# Patient Record
Sex: Female | Born: 2000 | Race: White | Hispanic: No | Marital: Single | Smoking: Never smoker
Health system: Southern US, Community
[De-identification: ages and names within clinical notes are randomized; demographics above are authoritative.]

## PROBLEM LIST (undated history)

## (undated) ENCOUNTER — Inpatient Hospital Stay: Payer: Self-pay

## (undated) DIAGNOSIS — IMO0002 Reserved for concepts with insufficient information to code with codable children: Secondary | ICD-10-CM

## (undated) DIAGNOSIS — O0930 Supervision of pregnancy with insufficient antenatal care, unspecified trimester: Secondary | ICD-10-CM

## (undated) HISTORY — PX: OTHER SURGICAL HISTORY: SHX169

## (undated) HISTORY — PX: CHOLECYSTECTOMY: SHX55

---
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
## 2019-08-14 IMAGING — CR LEFT FOREARM - 2 VIEW
1 series · 2 of 2 positions shown · non-contrast
Comparison: None.

EXAM:
LEFT FOREARM - 2 VIEW

[Series 1: dg forearm left · 0.14mm/px · 2 of 2 slices shown]
[im 1/2]
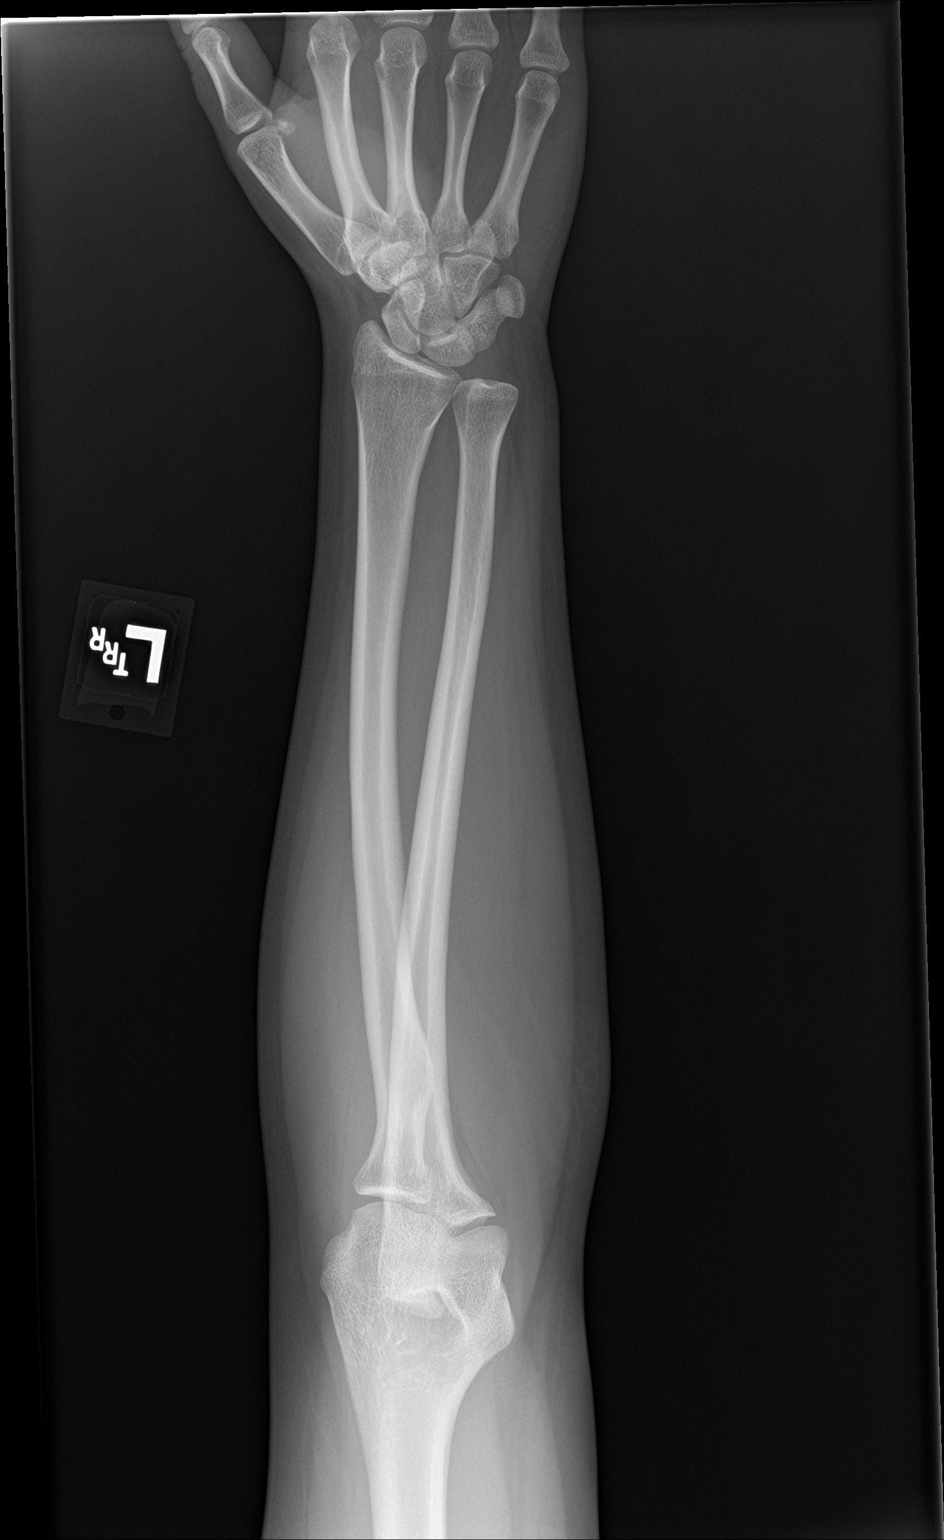
[im 2/2]
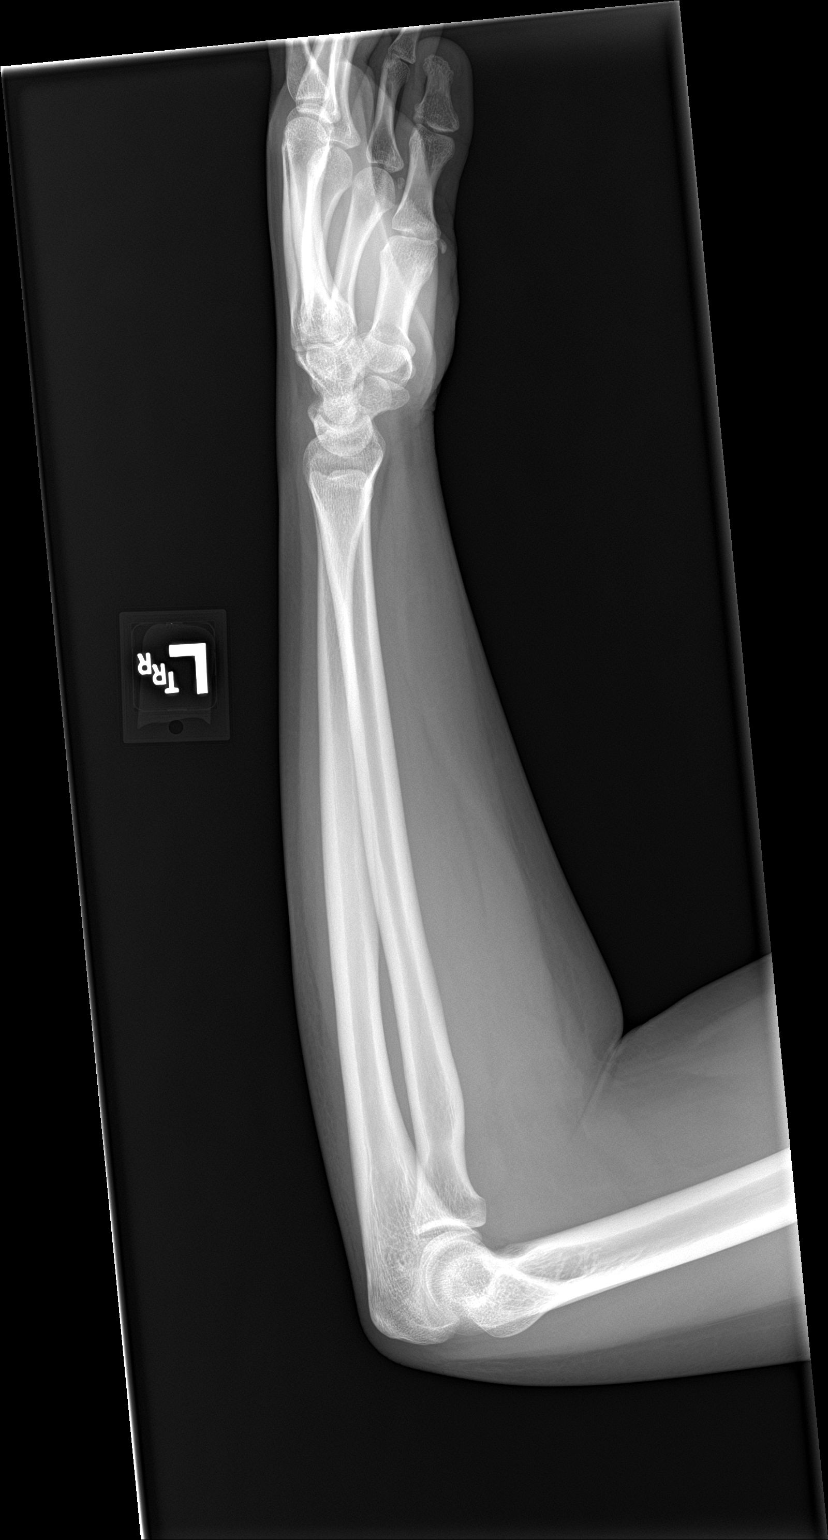

[2 of 2 positions shown; findings below may reference images not displayed]

FINDINGS: There is no evidence of fracture or other focal bone lesions. Soft
tissues are unremarkable.
IMPRESSION: Negative.

## 2019-12-31 ENCOUNTER — Emergency Department: Payer: Medicaid Other

## 2019-12-31 ENCOUNTER — Emergency Department
Admission: EM | Admit: 2019-12-31 | Discharge: 2020-01-01 | Disposition: A | Discharge status: Home / Self Care | Payer: Medicaid Other | Attending: Emergency Medicine | Admitting: Emergency Medicine

## 2019-12-31 ENCOUNTER — Other Ambulatory Visit: Payer: Self-pay

## 2019-12-31 DIAGNOSIS — M25571 Pain in right ankle and joints of right foot: Secondary | ICD-10-CM | POA: Diagnosis present

## 2019-12-31 DIAGNOSIS — Z5321 Procedure and treatment not carried out due to patient leaving prior to being seen by health care provider: Secondary | ICD-10-CM | POA: Diagnosis not present

## 2019-12-31 NOTE — ED Triage Notes (Signed)
Last night patient states she was sitting on her feet with her legs under her and she went to turn to her hip and head a pop. She tried to tough it out today but states she took advil but it is not doing anything. Right ankle is a little swollen.

## 2020-07-22 ENCOUNTER — Telehealth: Payer: Self-pay | Admitting: Obstetrics & Gynecology

## 2020-07-22 NOTE — Telephone Encounter (Signed)
Sarah Kennedy referring for NOB. Phone number on file not accepting calls at this time.x 2

## 2020-07-25 NOTE — Telephone Encounter (Signed)
Phone number on file not accepting calls at this time. Unable to leave message for patient to call back to scheduled

## 2020-08-13 IMAGING — CR DG ANKLE COMPLETE 3+V*R*
1 series · 3 of 3 positions shown · non-contrast
Comparison: None.

CLINICAL DATA: 18-year-old female with right ankle pain.

EXAM:
RIGHT ANKLE - COMPLETE 3+ VIEW

[Series 1: x ankle ap right · 0.14mm/px · 3 of 3 slices shown]
[im 1/3]
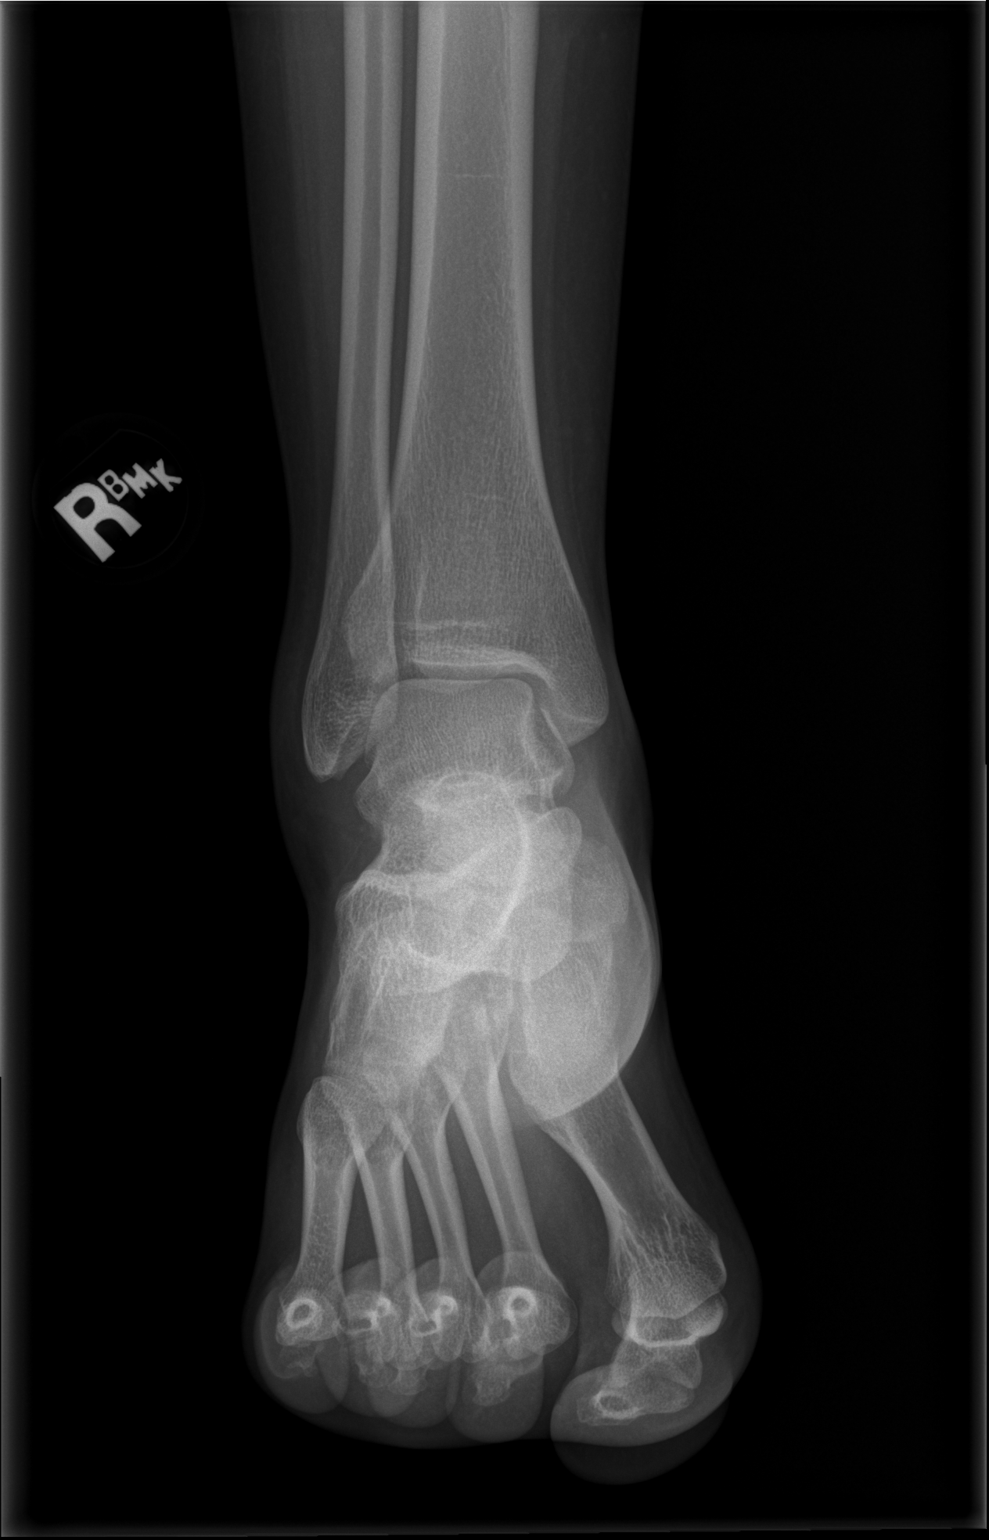
[im 2/3]
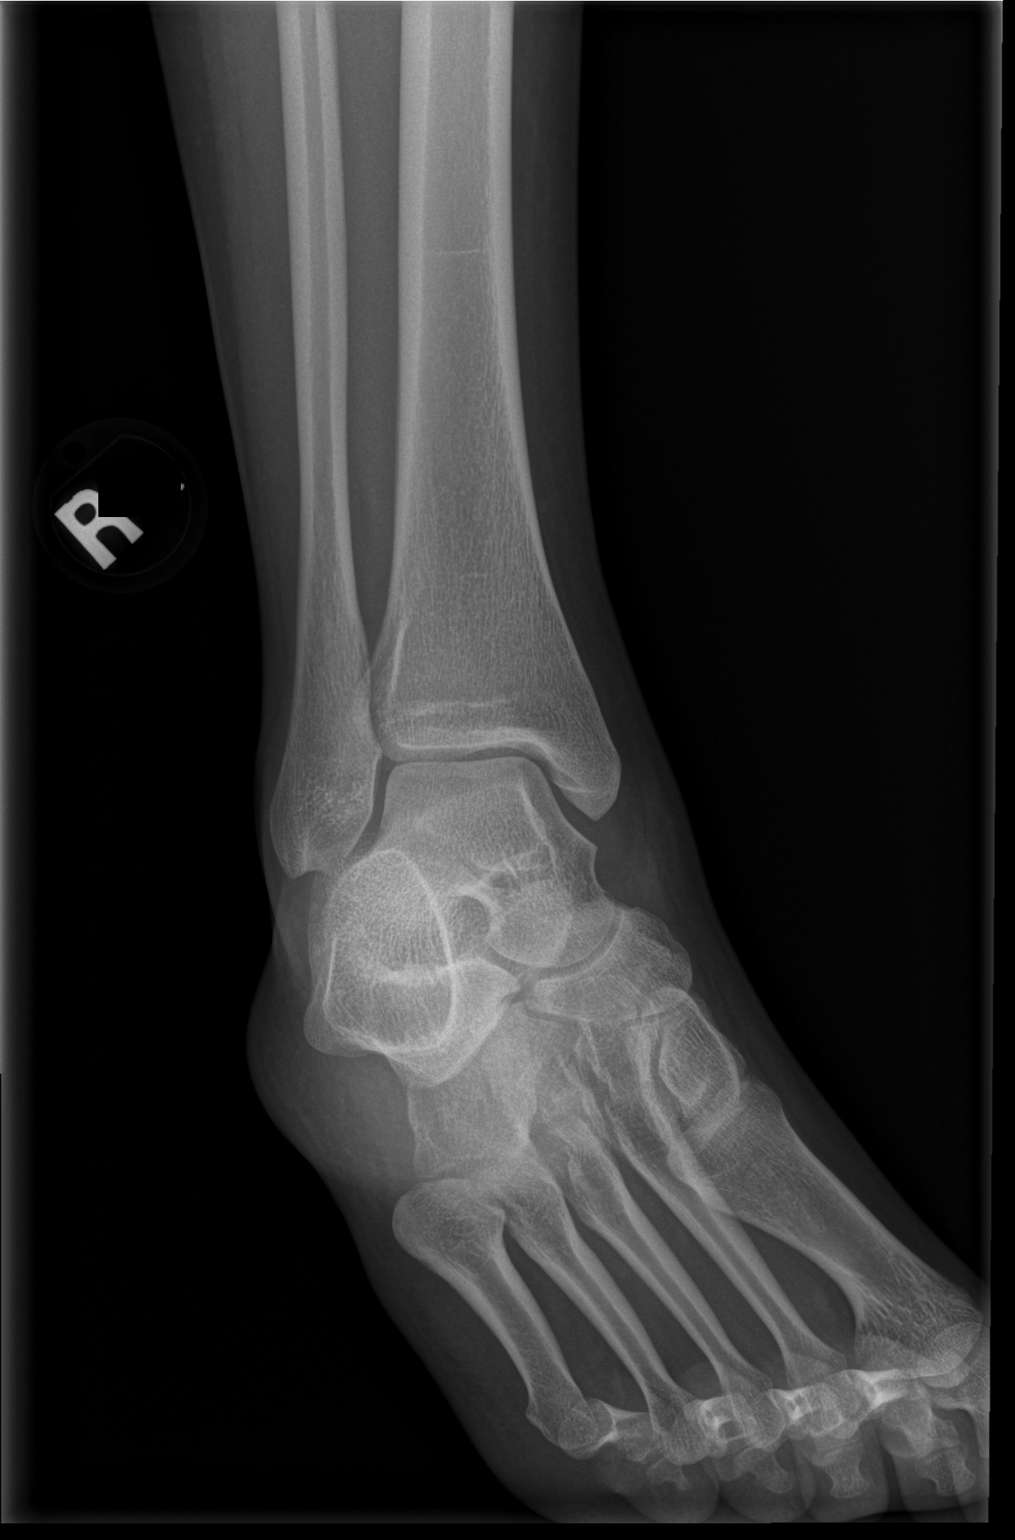
[im 3/3]
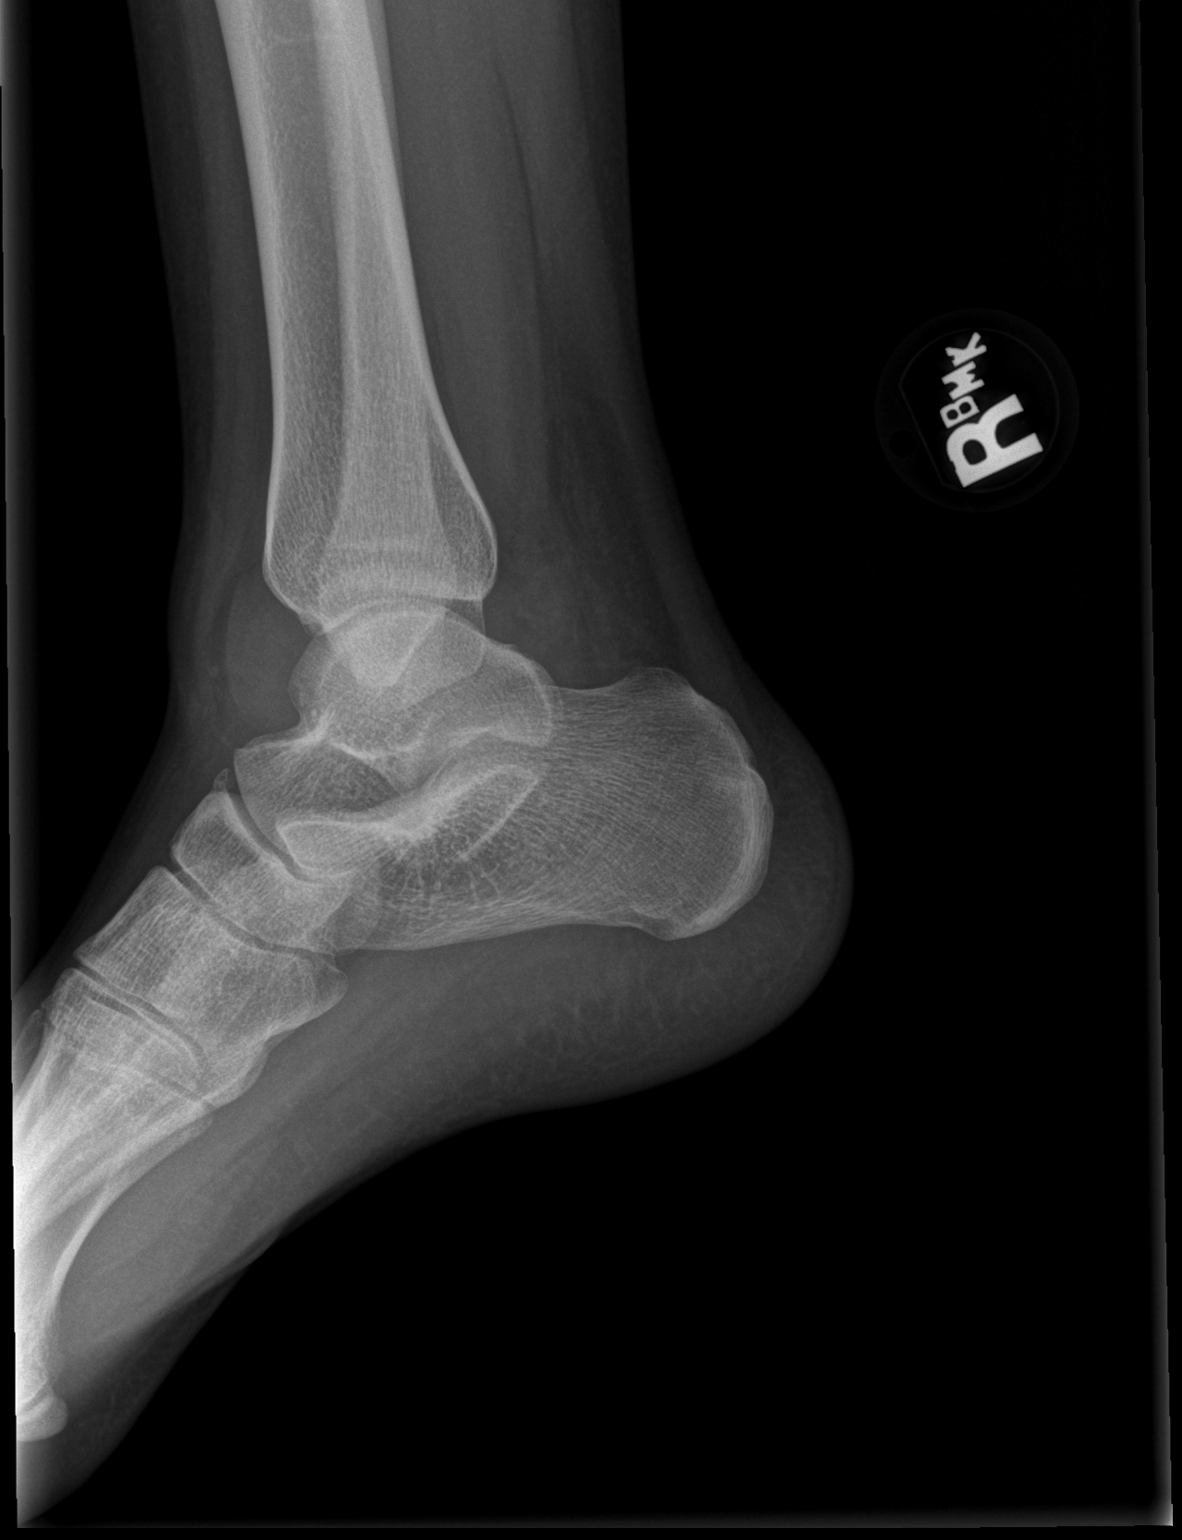

[3 of 3 positions shown; findings below may reference images not displayed]

FINDINGS: There is no acute fracture or dislocation. The bones are well
mineralized. No arthritic changes. The ankle mortise is intact. A
small amount of effusion may be present anterior to the ankle. The
soft tissues are unremarkable.
IMPRESSION: 1. No acute fracture or dislocation.
2. Probable small effusion anterior to the ankle.

## 2021-01-13 ENCOUNTER — Emergency Department: Payer: Medicaid Other

## 2021-01-13 ENCOUNTER — Emergency Department
Admission: EM | Admit: 2021-01-13 | Discharge: 2021-01-13 | Disposition: A | Discharge status: Home / Self Care | Payer: Medicaid Other | Attending: Emergency Medicine | Admitting: Emergency Medicine

## 2021-01-13 ENCOUNTER — Other Ambulatory Visit: Payer: Self-pay

## 2021-01-13 DIAGNOSIS — J988 Other specified respiratory disorders: Secondary | ICD-10-CM | POA: Insufficient documentation

## 2021-01-13 DIAGNOSIS — R0602 Shortness of breath: Secondary | ICD-10-CM

## 2021-01-13 DIAGNOSIS — Z20822 Contact with and (suspected) exposure to covid-19: Secondary | ICD-10-CM | POA: Insufficient documentation

## 2021-01-13 LAB — RESP PANEL BY RT-PCR (FLU A&B, COVID) ARPGX2
Influenza A by PCR: NEGATIVE
Influenza B by PCR: NEGATIVE
SARS Coronavirus 2 by RT PCR: NEGATIVE

## 2021-01-13 MED ORDER — AEROCHAMBER MV MISC: 0 refills | Status: DC

## 2021-01-13 MED ORDER — ALBUTEROL SULFATE HFA 108 (90 BASE) MCG/ACT IN AERS: 2.0000 | INHALATION_SPRAY | Freq: Four times a day (QID) | RESPIRATORY_TRACT | 2 refills | Status: DC | PRN

## 2021-01-13 MED ORDER — IPRATROPIUM-ALBUTEROL 0.5-2.5 (3) MG/3ML IN SOLN
3.0000 mL | Freq: Once | RESPIRATORY_TRACT | Status: AC
  Administered 2021-01-13: 3 mL via RESPIRATORY_TRACT
  Filled 2021-01-13: qty 3

## 2021-01-13 MED ORDER — PREDNISONE 10 MG (21) PO TBPK: ORAL_TABLET | ORAL | 0 refills | Status: DC

## 2021-01-13 NOTE — ED Provider Notes (Signed)
[facility] Emergency Department Provider Note   ____________________________________________   Event Date/Time   First MD Initiated Contact with Patient 01/13/21 [phone]     (approximate)  I have reviewed the triage vital signs and the nursing notes.   HISTORY  Chief Complaint Shortness of Breath    HPI Sarah Kennedy is a 20 y.o. female with no stated past medical history and past surgical history of cholecystectomy who presents for shortness of breath that began last night.  Patient also endorses chest pressure as if "something is sitting on my chest".  Patient states that this the shortness of breath has been worsening since that began last night.  Patient denies any past medical history of COPD, asthma, or tobacco abuse.  Patient currently denies any vision changes, tinnitus, difficulty speaking, facial droop, sore throat, abdominal pain, nausea/vomiting/diarrhea, dysuria, or weakness/numbness/paresthesias in any extremity         Past Medical History:  Diagnosis Date  . Late prenatal care    @32  weeks  . Teen pregnancy     Patient Active Problem List   Diagnosis Date Noted  . Postpartum care following vaginal delivery 11/16/2015    Past Surgical History:  Procedure Laterality Date  . CHOLECYSTECTOMY    . denies      Prior to Admission medications   Medication Sig Start Date End Date Taking? Authorizing Provider  albuterol (VENTOLIN HFA) 108 (90 Base) MCG/ACT inhaler Inhale 2 puffs into the lungs every 6 (six) hours as needed for wheezing or shortness of breath. 01/13/21  Yes 03/15/21, MD  predniSONE (STERAPRED UNI-PAK 21 TAB) 10 MG (21) TBPK tablet As described on package 01/13/21  Yes 03/15/21, MD  Spacer/Aero-Holding Chambers (AEROCHAMBER MV) inhaler Use as instructed 01/13/21  Yes 03/15/21, MD  cyclobenzaprine (FLEXERIL) 5 MG tablet Take 1-2 tablets 3 times daily as needed 12/31/18   01/02/19, PA-C  ferrous  fumarate (HEMOCYTE - 106 MG FE) 325 (106 Fe) MG TABS tablet Take 1 tablet (106 mg of iron total) by mouth daily. 11/20/15   01/18/16, CNM  HYDROcodone-acetaminophen (NORCO) 5-325 MG tablet Take 1 tablet by mouth every 6 (six) hours as needed for moderate pain or severe pain. 11/20/15   01/18/16, CNM  ibuprofen (ADVIL,MOTRIN) 600 MG tablet Take 1 tablet (600 mg total) by mouth every 6 (six) hours as needed. 12/31/18   01/02/19, PA-C  Prenatal Vit-Fe Fumarate-FA (PRENATAL MULTIVITAMIN) TABS tablet Take 1 tablet by mouth daily at 12 noon.    [provider]    Allergies Patient has no known allergies.  No family history on file.  Social History Social History   Tobacco Use  . Smoking status: Never Smoker  . Smokeless tobacco: Never Used  Substance Use Topics  . Alcohol use: No  . Drug use: No    Review of Systems Constitutional: No fever/chills Eyes: No visual changes. ENT: No sore throat. Cardiovascular: Endorses chest pressure. Respiratory: Endorses shortness of breath. Gastrointestinal: No abdominal pain.  No nausea, no vomiting.  No diarrhea. Genitourinary: Negative for dysuria. Musculoskeletal: Negative for acute arthralgias Skin: Negative for rash. Neurological: Negative for headaches, weakness/numbness/paresthesias in any extremity Psychiatric: Negative for suicidal ideation/homicidal ideation   ____________________________________________   PHYSICAL EXAM:  VITAL SIGNS: ED Triage Vitals  Enc Vitals Group     BP 01/13/21 0943 (!) 102/56     Pulse Rate 01/13/21 0939 (!) 107     Resp 01/13/21 0939 03/15/21)  22     Temp 01/13/21 0939 97.7 F (36.5 C)     Temp Source 01/13/21 0939 Oral     SpO2 01/13/21 0939 97 %     Weight 01/13/21 0941 150 lb (68 kg)     Height 01/13/21 0941 5\' 1"  (1.549 m)     Head Circumference --      Peak Flow --      Pain Score 01/13/21 0940 8     Pain Loc --      Pain Edu? --      Excl. in GC? --     Constitutional: Alert and oriented. Well appearing overweight Caucasian female who appears stated age and in no acute distress. Eyes: Conjunctivae are normal. PERRL. Head: Atraumatic. Nose: No congestion/rhinnorhea. Mouth/Throat: Mucous membranes are moist. Neck: No stridor Cardiovascular: Grossly normal heart sounds.  Good peripheral circulation. Respiratory: Respiratory effort with inspiratory and expiratory wheezes over bilateral lung fields.  No retractions. Gastrointestinal: Soft and nontender. No distention. Musculoskeletal: No obvious deformities Neurologic:  Normal speech and language. No gross focal neurologic deficits are appreciated. Skin:  Skin is warm and dry. No rash noted. Psychiatric: Mood and affect are normal. Speech and behavior are normal.  ____________________________________________   LABS (all labs ordered are listed, but only abnormal results are displayed)  Labs Reviewed  RESP PANEL BY RT-PCR (FLU A&B, COVID) [id number]   ____________________________________________  EKG  ED ECG REPORT I, 03/15/21, the attending physician, personally viewed and interpreted this ECG.  Date: 01/13/2021 EKG Time: 0944 Rate: 98 Rhythm: normal sinus rhythm QRS Axis: normal Intervals: normal ST/T Wave abnormalities: normal Narrative Interpretation: no evidence of acute ischemia  ____________________________________________  RADIOLOGY  ED MD interpretation: View portable chest x-ray shows no evidence of acute abnormalities including no pneumonia, pneumothorax, or widened mediastinum  Official radiology report(s): DG Chest Port 1 View  Result Date: 01/13/2021 CLINICAL DATA:  Shortness of breath EXAM: PORTABLE CHEST 1 VIEW COMPARISON:  None. FINDINGS: Low lung volumes. No consolidation or edema. No pleural effusion or pneumothorax. Cardiomediastinal contours are within normal limits with normal heart size. No acute osseous abnormality. IMPRESSION: No acute process  in the chest. Electronically Signed   By: 03/15/2021 M.D.   On: 01/13/2021 11:16    ____________________________________________   PROCEDURES  Procedure(s) performed (including Critical Care):  .1-3 Lead EKG Interpretation Performed by: 03/15/2021, MD Authorized by: Merwyn Katos, MD     Interpretation: normal     ECG rate:  91   ECG rate assessment: normal     Rhythm: sinus rhythm     Ectopy: none     Conduction: normal       ____________________________________________   INITIAL IMPRESSION / ASSESSMENT AND PLAN / ED COURSE  As part of my medical decision making, I reviewed the following data within the electronic MEDICAL RECORD NUMBER Nursing notes reviewed and incorporated, Labs reviewed, EKG interpreted, Old chart reviewed, Radiograph reviewed and Notes from prior ED visits reviewed and incorporated        Presentation most consistent with acute upper respiratory infection with wheezing.  Presentation less concerning for pneumonia, heart failure, foreign body airway obstruction, pulmonary embolism, tamponade, atypical ACS  Reassuring factors: No AMS, silent respirations, belly-breathing, or other sign of impending ventilatory failure. Never intubated or admitted to the hospital for asthma exacerbation.  Workup Defer labs and imaging given clinically in exacerbation of known asthma with similar exacerbation presentations per patient.  Reassessment: Patient respiratory status  improved with breathing treatment.  Disposition: Discharge home with return precautions. Advised to follow up with primary care physician within next 24-48 hours.      ____________________________________________   FINAL CLINICAL IMPRESSION(S) / ED DIAGNOSES  Final diagnoses:  Wheezing-associated respiratory infection  SOB (shortness of breath)     ED Discharge Orders         Ordered    albuterol (VENTOLIN HFA) 108 (90 Base) MCG/ACT inhaler  Every 6 hours PRN         01/13/21 1136    Spacer/Aero-Holding Chambers (AEROCHAMBER MV) inhaler        01/13/21 1136    predniSONE (STERAPRED UNI-PAK 21 TAB) 10 MG (21) TBPK tablet        01/13/21 1136           Note:  This document was prepared using Dragon voice recognition software and may include unintentional dictation errors.   Merwyn Katos, MD 01/13/21 [phone]

## 2021-01-13 NOTE — ED Triage Notes (Signed)
Pt states since last night she has had difficulty breathing and it has gotten worse this morning- pt states it feels like "something is sitting on my chest"- pt tearful in triage room, able to speak in complete sentences

## 2023-02-18 ENCOUNTER — Ambulatory Visit (INDEPENDENT_AMBULATORY_CARE_PROVIDER_SITE_OTHER): Payer: Self-pay

## 2023-02-18 VITALS — Wt 150.0 lb

## 2023-02-18 DIAGNOSIS — Z3689 Encounter for other specified antenatal screening: Secondary | ICD-10-CM

## 2023-02-18 DIAGNOSIS — Z369 Encounter for antenatal screening, unspecified: Secondary | ICD-10-CM

## 2023-02-18 DIAGNOSIS — Z3480 Encounter for supervision of other normal pregnancy, unspecified trimester: Secondary | ICD-10-CM | POA: Insufficient documentation

## 2023-02-18 NOTE — Patient Instructions (Signed)
First Trimester of Pregnancy  The first trimester of pregnancy starts on the first day of your last menstrual period until the end of week 12. This is also called months 1 through 3 of pregnancy. Body changes during your first trimester Your body goes through many changes during pregnancy. The changes usually return to normal after your baby is born. Physical changes You may gain or lose weight. Your breasts may grow larger and hurt. The area around your nipples may get darker. Dark spots or blotches may develop on your face. You may have changes in your hair. Health changes You may feel like you might vomit (nauseous), and you may vomit. You may have heartburn. You may have headaches. You may have trouble pooping (constipation). Your gums may bleed. Other changes You may get tired easily. You may pee (urinate) more often. Your menstrual periods will stop. You may not feel hungry. You may want to eat certain kinds of food. You may have changes in your emotions from day to day. You may have more dreams. Follow these instructions at home: Medicines Take over-the-counter and prescription medicines only as told by your doctor. Some medicines are not safe during pregnancy. Take a prenatal vitamin that contains at least 600 micrograms (mcg) of folic acid. Eating and drinking Eat healthy meals that include: Fresh fruits and vegetables. Whole grains. Good sources of protein, such as meat, eggs, or tofu. Low-fat dairy products. Avoid raw meat and unpasteurized juice, milk, and cheese. If you feel like you may vomit, or you vomit: Eat 4 or 5 small meals a day instead of 3 large meals. Try eating a few soda crackers. Drink liquids between meals instead of during meals. You may need to take these actions to prevent or treat trouble pooping: Drink enough fluids to keep your pee (urine) pale yellow. Eat foods that are high in fiber. These include beans, whole grains, and fresh fruits and  vegetables. Limit foods that are high in fat and sugar. These include fried or sweet foods. Activity Exercise only as told by your doctor. Most people can do their usual exercise routine during pregnancy. Stop exercising if you have cramps or pain in your lower belly (abdomen) or low back. Do not exercise if it is too hot or too humid, or if you are in a place of great height (high altitude). Avoid heavy lifting. If you choose to, you may have sex unless your doctor tells you not to. Relieving pain and discomfort Wear a good support bra if your breasts are sore. Rest with your legs raised (elevated) if you have leg cramps or low back pain. If you have bulging veins (varicose veins) in your legs: Wear support hose as told by your doctor. Raise your feet for 15 minutes, 3-4 times a day. Limit salt in your food. Safety Wear your seat belt at all times when you are in a car. Talk with your doctor if someone is hurting you or yelling at you. Talk with your doctor if you are feeling sad or have thoughts of hurting yourself. Lifestyle Do not use hot tubs, steam rooms, or saunas. Do not douche. Do not use tampons or scented sanitary pads. Do not use herbal medicines, illegal drugs, or medicines that are not approved by your doctor. Do not drink alcohol. Do not smoke or use any products that contain nicotine or tobacco. If you need help quitting, ask your doctor. Avoid cat litter boxes and soil that is used by cats. These carry   germs that can cause harm to the baby and can cause a loss of your baby by miscarriage or stillbirth. General instructions Keep all follow-up visits. This is important. Ask for help if you need counseling or if you need help with nutrition. Your doctor can give you advice or tell you where to go for help. Visit your dentist. At home, brush your teeth with a soft toothbrush. Floss gently. Write down your questions. Take them to your prenatal visits. Where to find more  information American Pregnancy Association: americanpregnancy.org American College of Obstetricians and Gynecologists: www.acog.org Office on Women's Health: womenshealth.gov/pregnancy Contact a doctor if: You are dizzy. You have a fever. You have mild cramps or pressure in your lower belly. You have a nagging pain in your belly area. You continue to feel like you may vomit, you vomit, or you have watery poop (diarrhea) for 24 hours or longer. You have a bad-smelling fluid coming from your vagina. You have pain when you pee. You are exposed to a disease that spreads from person to person, such as chickenpox, measles, Zika virus, HIV, or hepatitis. Get help right away if: You have spotting or bleeding from your vagina. You have very bad belly cramping or pain. You have shortness of breath or chest pain. You have any kind of injury, such as from a fall or a car crash. You have new or increased pain, swelling, or redness in an arm or leg. Summary The first trimester of pregnancy starts on the first day of your last menstrual period until the end of week 12 (months 1 through 3). Eat 4 or 5 small meals a day instead of 3 large meals. Do not smoke or use any products that contain nicotine or tobacco. If you need help quitting, ask your doctor. Keep all follow-up visits. This information is not intended to replace advice given to you by your health care provider. Make sure you discuss any questions you have with your health care provider. Document Revised: 03/02/2020 Document Reviewed: 01/07/2020 Elsevier Patient Education  2023 Elsevier Inc. Commonly Asked Questions During Pregnancy  Cats: A parasite can be excreted in cat feces.  To avoid exposure you need to have another person empty the little box.  If you must empty the litter box you will need to wear gloves.  Wash your hands after handling your cat.  This parasite can also be found in raw or undercooked meat so this should also be  avoided.  Colds, Sore Throats, Flu: Please check your medication sheet to see what you can take for symptoms.  If your symptoms are unrelieved by these medications please call the office.  Dental Work: Most any dental work your dentist recommends is permitted.  X-rays should only be taken during the first trimester if absolutely necessary.  Your abdomen should be shielded with a lead apron during all x-rays.  Please notify your provider prior to receiving any x-rays.  Novocaine is fine; gas is not recommended.  If your dentist requires a note from us prior to dental work please call the office and we will provide one for you.  Exercise: Exercise is an important part of staying healthy during your pregnancy.  You may continue most exercises you were accustomed to prior to pregnancy.  Later in your pregnancy you will most likely notice you have difficulty with activities requiring balance like riding a bicycle.  It is important that you listen to your body and avoid activities that put you at a higher   risk of falling.  Adequate rest and staying well hydrated are a must!  If you have questions about the safety of specific activities ask your provider.    Exposure to Children with illness: Try to avoid obvious exposure; report any symptoms to us when noted,  If you have chicken pos, red measles or mumps, you should be immune to these diseases.   Please do not take any vaccines while pregnant unless you have checked with your OB provider.  Fetal Movement: After 28 weeks we recommend you do "kick counts" twice daily.  Lie or sit down in a calm quiet environment and count your baby movements "kicks".  You should feel your baby at least 10 times per hour.  If you have not felt 10 kicks within the first hour get up, walk around and have something sweet to eat or drink then repeat for an additional hour.  If count remains less than 10 per hour notify your provider.  Fumigating: Follow your pest control agent's  advice as to how long to stay out of your home.  Ventilate the area well before re-entering.  Hemorrhoids:   Most over-the-counter preparations can be used during pregnancy.  Check your medication to see what is safe to use.  It is important to use a stool softener or fiber in your diet and to drink lots of liquids.  If hemorrhoids seem to be getting worse please call the office.   Hot Tubs:  Hot tubs Jacuzzis and saunas are not recommended while pregnant.  These increase your internal body temperature and should be avoided.  Intercourse:  Sexual intercourse is safe during pregnancy as long as you are comfortable, unless otherwise advised by your provider.  Spotting may occur after intercourse; report any bright red bleeding that is heavier than spotting.  Labor:  If you know that you are in labor, please go to the hospital.  If you are unsure, please call the office and let us help you decide what to do.  Lifting, straining, etc:  If your job requires heavy lifting or straining please check with your provider for any limitations.  Generally, you should not lift items heavier than that you can lift simply with your hands and arms (no back muscles)  Painting:  Paint fumes do not harm your pregnancy, but may make you ill and should be avoided if possible.  Latex or water based paints have less odor than oils.  Use adequate ventilation while painting.  Permanents & Hair Color:  Chemicals in hair dyes are not recommended as they cause increase hair dryness which can increase hair loss during pregnancy.  " Highlighting" and permanents are allowed.  Dye may be absorbed differently and permanents may not hold as well during pregnancy.  Sunbathing:  Use a sunscreen, as skin burns easily during pregnancy.  Drink plenty of fluids; avoid over heating.  Tanning Beds:  Because their possible side effects are still unknown, tanning beds are not recommended.  Ultrasound Scans:  Routine ultrasounds are performed  at approximately 20 weeks.  You will be able to see your baby's general anatomy an if you would like to know the gender this can usually be determined as well.  If it is questionable when you conceived you may also receive an ultrasound early in your pregnancy for dating purposes.  Otherwise ultrasound exams are not routinely performed unless there is a medical necessity.  Although you can request a scan we ask that you pay for it when   conducted because insurance does not cover " patient request" scans.  Work: If your pregnancy proceeds without complications you may work until your due date, unless your physician or employer advises otherwise.  Round Ligament Pain/Pelvic Discomfort:  Sharp, shooting pains not associated with bleeding are fairly common, usually occurring in the second trimester of pregnancy.  They tend to be worse when standing up or when you remain standing for long periods of time.  These are the result of pressure of certain pelvic ligaments called "round ligaments".  Rest, Tylenol and heat seem to be the most effective relief.  As the womb and fetus grow, they rise out of the pelvis and the discomfort improves.  Please notify the office if your pain seems different than that described.  It may represent a more serious condition.  Common Medications Safe in Pregnancy  Acne:      Constipation:  Benzoyl Peroxide     Colace  Clindamycin      Dulcolax Suppository  Topica Erythromycin     Fibercon  Salicylic Acid      Metamucil         Miralax AVOID:        Senakot   Accutane    Cough:  Retin-A       Cough Drops  Tetracycline      Phenergan w/ Codeine if Rx  Minocycline      Robitussin (Plain & DM)  Antibiotics:     Crabs/Lice:  Ceclor       RID  Cephalosporins    AVOID:  E-Mycins      Kwell  Keflex  Macrobid/Macrodantin   Diarrhea:  Penicillin      Kao-Pectate  Zithromax      Imodium AD         PUSH FLUIDS AVOID:       Cipro     Fever:  Tetracycline      Tylenol (Regular  or Extra  Minocycline       Strength)  Levaquin      Extra Strength-Do not          Exceed 8 tabs/24 hrs Caffeine:        <200mg/day (equiv. To 1 cup of coffee or  approx. 3 12 oz sodas)         Gas: Cold/Hayfever:       Gas-X  Benadryl      Mylicon  Claritin       Phazyme  **Claritin-D        Chlor-Trimeton    Headaches:  Dimetapp      ASA-Free Excedrin  Drixoral-Non-Drowsy     Cold Compress  Mucinex (Guaifenasin)     Tylenol (Regular or Extra  Sudafed/Sudafed-12 Hour     Strength)  **Sudafed PE Pseudoephedrine   Tylenol Cold & Sinus     Vicks Vapor Rub  Zyrtec  **AVOID if Problems With Blood Pressure         Heartburn: Avoid lying down for at least 1 hour after meals  Aciphex      Maalox     Rash:  Milk of Magnesia     Benadryl    Mylanta       1% Hydrocortisone Cream  Pepcid  Pepcid Complete   Sleep Aids:  Prevacid      Ambien   Prilosec       Benadryl  Rolaids       Chamomile Tea  Tums (Limit 4/day)     Unisom           Tylenol PM         Warm milk-add vanilla or  Hemorrhoids:       Sugar for taste  Anusol/Anusol H.C.  (RX: Analapram 2.5%)  Sugar Substitutes:  Hydrocortisone OTC     Ok in moderation  Preparation H      Tucks        Vaseline lotion applied to tissue with wiping    Herpes:     Throat:  Acyclovir      Oragel  Famvir  Valtrex     Vaccines:         Flu Shot Leg Cramps:       *Gardasil  Benadryl      Hepatitis A         Hepatitis B Nasal Spray:       Pneumovax  Saline Nasal Spray     Polio Booster         Tetanus Nausea:       Tuberculosis test or PPD  Vitamin B6 25 mg TID   AVOID:    Dramamine      *Gardasil  Emetrol       Live Poliovirus  Ginger Root 250 mg QID    MMR (measles, mumps &  High Complex Carbs @ Bedtime    rebella)  Sea Bands-Accupressure    Varicella (Chickenpox)  Unisom 1/2 tab TID     *No known complications           If received before Pain:         Known pregnancy;   Darvocet       Resume series  after  Lortab        Delivery  Percocet    Yeast:   Tramadol      Femstat  Tylenol 3      Gyne-lotrimin  Ultram       Monistat  Vicodin           MISC:         All Sunscreens           Hair Coloring/highlights          Insect Repellant's          (Including DEET)         Mystic Tans  

## 2023-02-18 NOTE — Progress Notes (Signed)
New OB Intake  I connected with  Sarah Kennedy on 02/18/23 at 10:15 AM EDT by telephone and verified that I am speaking with the correct person using two identifiers. Nurse is located at [facility] and pt is located at home.  I explained I am completing New OB Intake today. We discussed her EDD of 09/22/2023 that is based on LMP of 12/16/2022. Pt is G4/P2012. I reviewed her allergies, medications, Medical/Surgical/OB history, and appropriate screenings. There are no cats in the home.  Based on history, this is a/an pregnancy uncomplicated .   Patient Active Problem List   Diagnosis Date Noted   Supervision of other normal pregnancy, antepartum 02/18/2023   Postpartum care following vaginal delivery 11/16/2015    Concerns addressed Nausea - ondansatron not working; adv per new protocol.  Delivery Plans:  Plans to deliver at [facility]; pt aware we only deliver at [facility]; if she goes to [facility] she will have someone deliver her that she hasn't met before.  Pt states 'okay'.  Anatomy US Explained first scheduled [place] will be 6/4th and an anatomy scan will be done at 20 weeks.  Labs Discussed genetic screening with patient. Patient desires genetic testing to be drawn with new OB labs. Discussed possible labs to be drawn at new OB appointment.  COVID Vaccine Patient has not had COVID vaccine.   Social Determinants of Health Food Insecurity: denies food insecurity Transportation: Patient denies transportation needs. Childcare: Discussed no children allowed at ultrasound appointments.   First visit review I reviewed new OB appt with pt. I explained she will have ob bloodwork and pap smear/pelvic exam if indicated. Explained pt will be seen by Dr. Brennan Bailey at first visit; encounter routed to appropriate provider.   Loran Senters, [place] 02/18/2023  11:01 AM

## 2023-02-21 ENCOUNTER — Other Ambulatory Visit: Payer: Self-pay

## 2023-02-21 ENCOUNTER — Emergency Department
Admission: EM | Admit: 2023-02-21 | Discharge: 2023-02-21 | Disposition: A | Discharge status: Home / Self Care | Payer: Medicaid Other | Attending: Emergency Medicine | Admitting: Emergency Medicine

## 2023-02-21 ENCOUNTER — Emergency Department: Payer: Medicaid Other

## 2023-02-21 DIAGNOSIS — O219 Vomiting of pregnancy, unspecified: Secondary | ICD-10-CM | POA: Diagnosis present

## 2023-02-21 DIAGNOSIS — R1030 Lower abdominal pain, unspecified: Secondary | ICD-10-CM | POA: Insufficient documentation

## 2023-02-21 DIAGNOSIS — Z3A08 8 weeks gestation of pregnancy: Secondary | ICD-10-CM | POA: Diagnosis not present

## 2023-02-21 DIAGNOSIS — R112 Nausea with vomiting, unspecified: Secondary | ICD-10-CM

## 2023-02-21 DIAGNOSIS — O26891 Other specified pregnancy related conditions, first trimester: Secondary | ICD-10-CM | POA: Insufficient documentation

## 2023-02-21 LAB — WET PREP, GENITAL
Clue Cells Wet Prep HPF POC: NONE SEEN
Sperm: NONE SEEN
Trich, Wet Prep: NONE SEEN
WBC, Wet Prep HPF POC: <10 (ref <10)
Yeast Wet Prep HPF POC: NONE SEEN

## 2023-02-21 LAB — CBC
HCT: 39.2 % (ref 36.0–46.0)
Hemoglobin: 13.8 g/dL (ref 12.0–15.0)
MCH: 30.5 pg (ref 26.0–34.0)
MCHC: 35.2 g/dL (ref 30.0–36.0)
MCV: 86.5 fL (ref 80.0–100.0)
Platelets: 307 10*3/uL (ref 150–400)
RBC: 4.53 MIL/uL (ref 3.87–5.11)
RDW: 12 % (ref 11.5–15.5)
WBC: 10.6 10*3/uL — ABNORMAL HIGH (ref 4.0–10.5)
nRBC: 0 % (ref 0.0–0.2)

## 2023-02-21 LAB — URINALYSIS, ROUTINE W REFLEX MICROSCOPIC
Bacteria, UA: NONE SEEN
Bilirubin Urine: NEGATIVE
Glucose, UA: NEGATIVE mg/dL
Hgb urine dipstick: NEGATIVE
Ketones, ur: 80 mg/dL — AB
Leukocytes,Ua: NEGATIVE
Nitrite: NEGATIVE
Protein, ur: 30 mg/dL — AB
Specific Gravity, Urine: 1.029 (ref 1.005–1.030)
pH: 6 (ref 5.0–8.0)

## 2023-02-21 LAB — COMPREHENSIVE METABOLIC PANEL
ALT: 13 U/L (ref 0–44)
AST: 15 U/L (ref 15–41)
Albumin: 3.9 g/dL (ref 3.5–5.0)
Alkaline Phosphatase: 44 U/L (ref 38–126)
Anion gap: 10 (ref 5–15)
BUN: 7 mg/dL (ref 6–20)
CO2: 18 mmol/L — ABNORMAL LOW (ref 22–32)
Calcium: 9.1 mg/dL (ref 8.9–10.3)
Chloride: 105 mmol/L (ref 98–111)
Creatinine, Ser: 0.52 mg/dL (ref 0.44–1.00)
GFR, Estimated: >60 mL/min (ref >60)
Glucose, Bld: 113 mg/dL — ABNORMAL HIGH (ref 70–99)
Potassium: 3.3 mmol/L — ABNORMAL LOW (ref 3.5–5.1)
Sodium: 133 mmol/L — ABNORMAL LOW (ref 135–145)
Total Bilirubin: 0.8 mg/dL (ref 0.3–1.2)
Total Protein: 7 g/dL (ref 6.5–8.1)

## 2023-02-21 LAB — CHLAMYDIA/NGC RT PCR (ARMC ONLY)
Chlamydia Tr: NOT DETECTED
N gonorrhoeae: NOT DETECTED

## 2023-02-21 LAB — HCG, QUANTITATIVE, PREGNANCY: hCG, Beta Chain, Quant, S: 160699 m[IU]/mL — ABNORMAL HIGH (ref <5)

## 2023-02-21 LAB — LIPASE, BLOOD: Lipase: 31 U/L (ref 11–51)

## 2023-02-21 MED ORDER — SODIUM CHLORIDE 0.9 % IV BOLUS
1000.0000 mL | Freq: Once | INTRAVENOUS | Status: AC
  Administered 2023-02-21: 1000 mL via INTRAVENOUS

## 2023-02-21 MED ORDER — POTASSIUM CHLORIDE CRYS ER 20 MEQ PO TBCR
40.0000 meq | EXTENDED_RELEASE_TABLET | Freq: Once | ORAL | Status: AC
  Administered 2023-02-21: 40 meq via ORAL
  Filled 2023-02-21: qty 2

## 2023-02-21 MED ORDER — METOCLOPRAMIDE HCL 10 MG PO TABS: 10.0000 mg | ORAL_TABLET | Freq: Three times a day (TID) | ORAL | 0 refills | Status: DC | PRN

## 2023-02-21 MED ORDER — METOCLOPRAMIDE HCL 5 MG/ML IJ SOLN
10.0000 mg | Freq: Once | INTRAMUSCULAR | Status: AC
  Administered 2023-02-21: 10 mg via INTRAVENOUS
  Filled 2023-02-21: qty 2

## 2023-02-21 NOTE — ED Triage Notes (Addendum)
Pt to ED via POV c/o R. Lower abd pain and nausea/'vomiting. Pt reports these symptoms started yesterday. Pt describes pain as sharp. Pt reports being around 8/[redacted] wks pregnant. Denies CP, SOB, fevers

## 2023-02-21 NOTE — ED Provider Notes (Signed)
[facility] Provider Note    Event Date/Time   First MD Initiated Contact with Patient 02/21/23 [phone]     (approximate)   History   Abdominal Pain   HPI  Sarah Kennedy is a 22 y.o. female who is currently 8 to [redacted] weeks pregnant who comes in with lower abdominal pain nausea, vomiting.  Patient reports that she has had this similar symptoms previously and was worked up at [facility] that was reassuring but was never told her results.  She denies being on any medications for nausea.  She reports that she does not have any pain in her abdomen unless she is coughing sneezing or vomiting.  She denies any concern for vaginal discharge.  She reports this is her third pregnancy.  Denies having the symptoms previously.     Physical Exam   Triage Vital Signs: ED Triage Vitals  Enc Vitals Group     BP 02/21/23 0434 103/60     Pulse Rate 02/21/23 0434 85     Resp 02/21/23 0434 17     Temp 02/21/23 0434 98.3 F (36.8 C)     Temp Source 02/21/23 0434 Oral     SpO2 02/21/23 0434 97 %     Weight 02/21/23 0435 150 lb (68 kg)     Height 02/21/23 0435 5\' 1"  (1.549 m)     Head Circumference --      Peak Flow --      Pain Score 02/21/23 0435 10     Pain Loc --      Pain Edu? --      Excl. in GC? --     Most recent vital signs: Vitals:   02/21/23 0434  BP: 103/60  Pulse: 85  Resp: 17  Temp: 98.3 F (36.8 C)  SpO2: 97%     General: Awake, no distress.  CV:  Good peripheral perfusion.  Resp:  Normal effort.  Abd:  No distention.  Soft and nontender Other:     ED Results / Procedures / Treatments   Labs (all labs ordered are listed, but only abnormal results are displayed) Labs Reviewed  CBC - Abnormal; Notable for the following components:      Result Value   WBC 10.6 (*)    All other components within normal limits  LIPASE, BLOOD  COMPREHENSIVE METABOLIC PANEL  URINALYSIS, ROUTINE W REFLEX MICROSCOPIC  POC URINE PREG, ED      RADIOLOGY Pending\  PROCEDURES:  Critical Care performed: No  Procedures   MEDICATIONS ORDERED IN ED: Medications  sodium chloride 0.9 % bolus 1,000 mL (0 mLs Intravenous Stopped 02/21/23 0647)  metoCLOPramide (REGLAN) injection 10 mg (10 mg Intravenous Given 02/21/23 0544)  potassium chloride SA (KLOR-CON M) CR tablet 40 mEq (40 mEq Oral Given 02/21/23 0608)     IMPRESSION / MDM / ASSESSMENT AND PLAN / ED COURSE  I reviewed the triage vital signs and the nursing notes.   Patient's presentation is most consistent with acute presentation with potential threat to life or bodily function.   Patient comes in with intermittent lower abdominal pain in the setting of pregnancy with nausea and vomiting suspect this could be hyperemesis related to pregnancy.  Will get ultrasound to make sure intrauterine pregnancy and no evidence of torsion.  Her abdomen at this time seems pretty nontender my suspicion for appendicitis seems lower given she does report some of the similar symptoms earlier in pregnancy as well and she really denies any  abdominal pain unless she is coughing or vomiting.  Her hCG is appropriately elevated lipase normal.  Patient does look dehydrated with low bicarb and low potassium low sodium.  Will give patient some IV fluids.  CBC slightly elevated white count most likely reactive from the vomiting.  Patient was given IV fluids IV Reglan.  Patient will be handed off to oncoming team pending ultrasounds and repeat evaluation  I attempted to reevaluate patient prior to leaving but she was an ultrasound  FINAL CLINICAL IMPRESSION(S) / ED DIAGNOSES   Final diagnoses:  None     Rx / DC Orders   ED Discharge Orders     None        Note:  This document was prepared using Dragon voice recognition software and may include unintentional dictation errors.   Concha Se, MD 02/21/23 [phone]

## 2023-02-21 NOTE — ED Provider Notes (Signed)
-----------------------------------------   8:11 AM on 02/21/2023 ----------------------------------------- Patient's ultrasound has resulted showing a subchorionic hematoma but otherwise reassuring ultrasound 8 weeks and 6 days with reassuring heart rate.  Patient states she is feeling much better.  States the Reglan worked very well for the patient.  Will prescribe Reglan to be used on occasion if needed by the patient at home.  She has an OB appointment coming up next month.   Minna Antis, MD 02/21/23 [phone]

## 2023-02-21 NOTE — Discharge Instructions (Signed)
Please take your nausea medication as needed but only as prescribed.  Please follow-up with your OB/GYN as soon as you are able.  Return to the emergency department for any symptom personally concerning to yourself especially if you are unable to tolerate oral fluids.

## 2023-02-21 NOTE — ED Notes (Signed)
Nausea has improved 

## 2023-03-12 ENCOUNTER — Encounter: Payer: Self-pay | Admitting: Obstetrics and Gynecology

## 2023-03-12 ENCOUNTER — Other Ambulatory Visit: Payer: Medicaid Other

## 2023-03-12 ENCOUNTER — Other Ambulatory Visit (HOSPITAL_COMMUNITY)
Admission: RE | Admit: 2023-03-12 | Discharge: 2023-03-12 | Disposition: A | Discharge status: Home / Self Care | Payer: Medicaid Other | Source: Ambulatory Visit | Attending: Obstetrics and Gynecology | Admitting: Obstetrics and Gynecology

## 2023-03-12 ENCOUNTER — Ambulatory Visit: Payer: Self-pay

## 2023-03-12 DIAGNOSIS — Z3480 Encounter for supervision of other normal pregnancy, unspecified trimester: Secondary | ICD-10-CM | POA: Insufficient documentation

## 2023-03-12 DIAGNOSIS — Z369 Encounter for antenatal screening, unspecified: Secondary | ICD-10-CM | POA: Diagnosis present

## 2023-03-13 LAB — CBC/D/PLT+RPR+RH+ABO+RUBIGG...
Antibody Screen: NEGATIVE
Basophils Absolute: 0 10*3/uL (ref 0.0–0.2)
Basos: 0 %
EOS (ABSOLUTE): 0.1 10*3/uL (ref 0.0–0.4)
Eos: 1 %
HCV Ab: NONREACTIVE
HIV Screen 4th Generation wRfx: NONREACTIVE
Hematocrit: 35.9 % (ref 34.0–46.6)
Hemoglobin: 12.6 g/dL (ref 11.1–15.9)
Hepatitis B Surface Ag: NEGATIVE
Immature Grans (Abs): 0 10*3/uL (ref 0.0–0.1)
Immature Granulocytes: 0 %
Lymphocytes Absolute: 2.1 10*3/uL (ref 0.7–3.1)
Lymphs: 23 %
MCH: 30.6 pg (ref 26.6–33.0)
MCHC: 35.1 g/dL (ref 31.5–35.7)
MCV: 87 fL (ref 79–97)
Monocytes Absolute: 0.5 10*3/uL (ref 0.1–0.9)
Monocytes: 5 %
Neutrophils Absolute: 6.5 10*3/uL (ref 1.4–7.0)
Neutrophils: 71 %
Platelets: 318 10*3/uL (ref 150–450)
RBC: 4.12 x10E6/uL (ref 3.77–5.28)
RDW: 12.3 % (ref 11.7–15.4)
RPR Ser Ql: NONREACTIVE
Rh Factor: POSITIVE
Rubella Antibodies, IGG: 1.03 index (ref >0.99)
Varicella zoster IgG: 420 index (ref >165)
WBC: 9.2 10*3/uL (ref 3.4–10.8)

## 2023-03-13 LAB — URINALYSIS, ROUTINE W REFLEX MICROSCOPIC
Glucose, UA: NEGATIVE
Nitrite, UA: NEGATIVE
RBC, UA: NEGATIVE
Specific Gravity, UA: 1.023 (ref 1.005–1.030)
Urobilinogen, Ur: 4 mg/dL — ABNORMAL HIGH (ref 0.2–1.0)
pH, UA: 7 (ref 5.0–7.5)

## 2023-03-13 LAB — MICROSCOPIC EXAMINATION
Casts: NONE SEEN /lpf
RBC, Urine: NONE SEEN /hpf (ref 0–2)
WBC, UA: NONE SEEN /hpf (ref 0–5)

## 2023-03-13 LAB — HCV INTERPRETATION

## 2023-03-14 LAB — URINE CYTOLOGY ANCILLARY ONLY
Chlamydia: NEGATIVE
Comment: NEGATIVE
Comment: NORMAL
Neisseria Gonorrhea: NEGATIVE

## 2023-03-14 LAB — CULTURE, OB URINE

## 2023-03-14 LAB — URINE CULTURE, OB REFLEX

## 2023-03-16 LAB — NICOTINE SCREEN, URINE: Cotinine Ql Scrn, Ur: NEGATIVE ng/mL

## 2023-03-16 LAB — MONITOR DRUG PROFILE 14(MW)
Amphetamine Scrn, Ur: NEGATIVE ng/mL
BARBITURATE SCREEN URINE: NEGATIVE ng/mL
BENZODIAZEPINE SCREEN, URINE: NEGATIVE ng/mL
Buprenorphine, Urine: NEGATIVE ng/mL
Cocaine (Metab) Scrn, Ur: NEGATIVE ng/mL
Creatinine(Crt), U: 197.8 mg/dL (ref 20.0–300.0)
Fentanyl, Urine: NEGATIVE pg/mL
Meperidine Screen, Urine: NEGATIVE ng/mL
Methadone Screen, Urine: NEGATIVE ng/mL
OXYCODONE+OXYMORPHONE UR QL SCN: NEGATIVE ng/mL
Opiate Scrn, Ur: NEGATIVE ng/mL
Ph of Urine: 6.8 (ref 4.5–8.9)
Phencyclidine Qn, Ur: NEGATIVE ng/mL
Propoxyphene Scrn, Ur: NEGATIVE ng/mL
SPECIFIC GRAVITY: 1.011
Tramadol Screen, Urine: NEGATIVE ng/mL

## 2023-03-16 LAB — CANNABINOID (GC/MS), URINE
Cannabinoid: POSITIVE — AB
Carboxy THC (GC/MS): >750 ng/mL

## 2023-03-19 LAB — MATERNIT 21 PLUS CORE, BLOOD
Fetal Fraction: 6
Result (T21): NEGATIVE
Trisomy 13 (Patau syndrome): NEGATIVE
Trisomy 18 (Edwards syndrome): NEGATIVE
Trisomy 21 (Down syndrome): NEGATIVE

## 2023-03-26 ENCOUNTER — Ambulatory Visit (INDEPENDENT_AMBULATORY_CARE_PROVIDER_SITE_OTHER): Payer: Medicaid Other | Admitting: Obstetrics and Gynecology

## 2023-03-26 ENCOUNTER — Encounter: Payer: Self-pay | Admitting: Obstetrics and Gynecology

## 2023-03-26 ENCOUNTER — Other Ambulatory Visit (HOSPITAL_COMMUNITY)
Admission: RE | Admit: 2023-03-26 | Discharge: 2023-03-26 | Disposition: A | Discharge status: Home / Self Care | Payer: Medicaid Other | Source: Ambulatory Visit | Attending: Obstetrics and Gynecology | Admitting: Obstetrics and Gynecology

## 2023-03-26 VITALS — BP 95/63 | HR 76 | Wt 166.8 lb

## 2023-03-26 DIAGNOSIS — Z124 Encounter for screening for malignant neoplasm of cervix: Secondary | ICD-10-CM

## 2023-03-26 DIAGNOSIS — Z3482 Encounter for supervision of other normal pregnancy, second trimester: Secondary | ICD-10-CM

## 2023-03-26 DIAGNOSIS — Z3A14 14 weeks gestation of pregnancy: Secondary | ICD-10-CM | POA: Diagnosis not present

## 2023-03-26 LAB — POCT URINALYSIS DIPSTICK OB
Bilirubin, UA: NEGATIVE
Blood, UA: NEGATIVE
Glucose, UA: NEGATIVE
Ketones, UA: NEGATIVE
Leukocytes, UA: NEGATIVE
Nitrite, UA: NEGATIVE
Spec Grav, UA: 1.015 (ref 1.010–1.025)
Urobilinogen, UA: 0.2 E.U./dL
pH, UA: 6 (ref 5.0–8.0)

## 2023-03-26 NOTE — Progress Notes (Signed)
NOB: She is doing well.  Her nausea and vomiting is slowly resolving.  She is taking B6 and Zofran to help.  4 cm fibroid and subchorionic hemorrhage noted at ultrasound.  Patient has no further bleeding.  Subchorionic hemorrhage and fibroid discussed today.  Third trimester ultrasound for growth. Pap smear performed today. aFP next visit. Physical examination General NAD, Conversant  HEENT Atraumatic; Op clear with mmm.  Normo-cephalic. Pupils reactive. Anicteric sclerae  Thyroid/Neck Smooth without nodularity or enlargement. Normal ROM.  Neck Supple.  Skin No rashes, lesions or ulceration. Normal palpated skin turgor. No nodularity.  Breasts: No masses or discharge.  Symmetric.  No axillary adenopathy.  Lungs: Clear to auscultation.No rales or wheezes. Normal Respiratory effort, no retractions.  Heart: NSR.  No murmurs or rubs appreciated. No peripheral edema  Abdomen: Soft.  Non-tender.  No masses.  No HSM. No hernia  Extremities: Moves all appropriately.  Normal ROM for age. No lymphadenopathy.  Neuro: Oriented to PPT.  Normal mood. Normal affect.     Pelvic:   Vulva: Normal appearance.  No lesions.  Vagina: No lesions or abnormalities noted.  Support: Normal pelvic support.  Urethra No masses tenderness or scarring.  Meatus Normal size without lesions or prolapse.  Cervix: Normal appearance.  No lesions.  Anus: Normal exam.  No lesions.  Perineum: Normal exam.  No lesions.        Bimanual   Adnexae: No masses.  Non-tender to palpation.  Uterus: Enlarged.  14 weeks positive fetal heart tones non-tender.  Mobile.  AV.  Adnexae: No masses.  Non-tender to palpation.  Cul-de-sac: Negative for abnormality.  Adnexae: No masses.  Non-tender to palpation.         Pelvimetry   Diagonal: Reached.  Spines: Average.  Sacrum: Concave.  Pubic Arch: Normal.

## 2023-03-26 NOTE — Progress Notes (Signed)
Patient presents today for New OB physical. She states doing well, taking prenatal and her nausea has decreased. Due for her pap smear. Patient completed genetic testing, Materniti21 negative. She states no other questions or concerns at this time.

## 2023-03-29 LAB — CYTOLOGY - PAP
Comment: NEGATIVE
High risk HPV: NEGATIVE

## 2023-04-07 ENCOUNTER — Observation Stay
Admission: EM | Admit: 2023-04-07 | Discharge: 2023-04-08 | Disposition: A | Discharge status: Home / Self Care | Payer: Medicaid Other | Attending: Certified Nurse Midwife | Admitting: Certified Nurse Midwife

## 2023-04-07 ENCOUNTER — Other Ambulatory Visit: Payer: Self-pay

## 2023-04-07 ENCOUNTER — Observation Stay: Payer: Medicaid Other

## 2023-04-07 ENCOUNTER — Emergency Department: Payer: Medicaid Other

## 2023-04-07 DIAGNOSIS — Z79899 Other long term (current) drug therapy: Secondary | ICD-10-CM | POA: Insufficient documentation

## 2023-04-07 DIAGNOSIS — G40909 Epilepsy, unspecified, not intractable, without status epilepticus: Secondary | ICD-10-CM | POA: Diagnosis not present

## 2023-04-07 DIAGNOSIS — Z3A16 16 weeks gestation of pregnancy: Secondary | ICD-10-CM | POA: Insufficient documentation

## 2023-04-07 DIAGNOSIS — D259 Leiomyoma of uterus, unspecified: Secondary | ICD-10-CM | POA: Insufficient documentation

## 2023-04-07 DIAGNOSIS — O0993 Supervision of high risk pregnancy, unspecified, third trimester: Secondary | ICD-10-CM | POA: Insufficient documentation

## 2023-04-07 DIAGNOSIS — F1290 Cannabis use, unspecified, uncomplicated: Secondary | ICD-10-CM | POA: Insufficient documentation

## 2023-04-07 DIAGNOSIS — O99352 Diseases of the nervous system complicating pregnancy, second trimester: Secondary | ICD-10-CM | POA: Diagnosis present

## 2023-04-07 DIAGNOSIS — R569 Unspecified convulsions: Secondary | ICD-10-CM | POA: Diagnosis not present

## 2023-04-07 HISTORY — DX: Epilepsy, unspecified, not intractable, without status epilepticus: G40.909

## 2023-04-07 LAB — COMPREHENSIVE METABOLIC PANEL
ALT: 10 U/L (ref 0–44)
AST: 12 U/L — ABNORMAL LOW (ref 15–41)
Albumin: 3.1 g/dL — ABNORMAL LOW (ref 3.5–5.0)
Alkaline Phosphatase: 38 U/L (ref 38–126)
Anion gap: 5 (ref 5–15)
BUN: 5 mg/dL — ABNORMAL LOW (ref 6–20)
CO2: 21 mmol/L — ABNORMAL LOW (ref 22–32)
Calcium: 8.3 mg/dL — ABNORMAL LOW (ref 8.9–10.3)
Chloride: 106 mmol/L (ref 98–111)
Creatinine, Ser: 0.5 mg/dL (ref 0.44–1.00)
GFR, Estimated: >60 mL/min (ref >60)
Glucose, Bld: 112 mg/dL — ABNORMAL HIGH (ref 70–99)
Potassium: 3 mmol/L — ABNORMAL LOW (ref 3.5–5.1)
Sodium: 132 mmol/L — ABNORMAL LOW (ref 135–145)
Total Bilirubin: <0.1 mg/dL — ABNORMAL LOW (ref 0.3–1.2)
Total Protein: 5.9 g/dL — ABNORMAL LOW (ref 6.5–8.1)

## 2023-04-07 LAB — URINE DRUG SCREEN, QUALITATIVE (ARMC ONLY)
Amphetamines, Ur Screen: NOT DETECTED
Barbiturates, Ur Screen: NOT DETECTED
Benzodiazepine, Ur Scrn: NOT DETECTED
Cannabinoid 50 Ng, Ur ~~LOC~~: POSITIVE — AB
Cocaine Metabolite,Ur ~~LOC~~: NOT DETECTED
MDMA (Ecstasy)Ur Screen: NOT DETECTED
Methadone Scn, Ur: NOT DETECTED
Opiate, Ur Screen: NOT DETECTED
Phencyclidine (PCP) Ur S: NOT DETECTED
Tricyclic, Ur Screen: NOT DETECTED

## 2023-04-07 LAB — PROTEIN / CREATININE RATIO, URINE
Creatinine, Urine: 110 mg/dL
Protein Creatinine Ratio: 0.15 mg/mg{Cre} (ref 0.00–0.15)
Total Protein, Urine: 17 mg/dL

## 2023-04-07 LAB — CBC
HCT: 33.7 % — ABNORMAL LOW (ref 36.0–46.0)
Hemoglobin: 11.9 g/dL — ABNORMAL LOW (ref 12.0–15.0)
MCH: 31.1 pg (ref 26.0–34.0)
MCHC: 35.3 g/dL (ref 30.0–36.0)
MCV: 88 fL (ref 80.0–100.0)
Platelets: 257 10*3/uL (ref 150–400)
RBC: 3.83 MIL/uL — ABNORMAL LOW (ref 3.87–5.11)
RDW: 13.9 % (ref 11.5–15.5)
WBC: 10.5 10*3/uL (ref 4.0–10.5)
nRBC: 0 % (ref 0.0–0.2)

## 2023-04-07 LAB — URINALYSIS, COMPLETE (UACMP) WITH MICROSCOPIC
Bilirubin Urine: NEGATIVE
Glucose, UA: NEGATIVE mg/dL
Hgb urine dipstick: NEGATIVE
Ketones, ur: NEGATIVE mg/dL
Nitrite: NEGATIVE
Protein, ur: 30 mg/dL — AB
Specific Gravity, Urine: 1.014 (ref 1.005–1.030)
pH: 7 (ref 5.0–8.0)

## 2023-04-07 MED ORDER — NITROFURANTOIN MONOHYD MACRO 100 MG PO CAPS
100.0000 mg | ORAL_CAPSULE | Freq: Once | ORAL | Status: AC
  Administered 2023-04-07: 100 mg via ORAL
  Filled 2023-04-07: qty 1

## 2023-04-07 MED ORDER — PRENATAL MULTIVITAMIN CH
1.0000 | ORAL_TABLET | Freq: Every day | ORAL | Status: DC
  Administered 2023-04-07 – 2023-04-08 (×2): 1 via ORAL
  Filled 2023-04-07 (×2): qty 1

## 2023-04-07 MED ORDER — ACETAMINOPHEN 325 MG PO TABS: 650.0000 mg | ORAL_TABLET | ORAL | Status: DC | PRN

## 2023-04-07 MED ORDER — DOCUSATE SODIUM 100 MG PO CAPS
100.0000 mg | ORAL_CAPSULE | Freq: Every day | ORAL | Status: DC
  Administered 2023-04-07 – 2023-04-08 (×2): 100 mg via ORAL
  Filled 2023-04-07 (×2): qty 1

## 2023-04-07 MED ORDER — ONDANSETRON HCL 4 MG/2ML IJ SOLN
4.0000 mg | INTRAMUSCULAR | Status: DC | PRN
  Administered 2023-04-07: 4 mg via INTRAVENOUS
  Filled 2023-04-07: qty 2

## 2023-04-07 MED ORDER — CALCIUM CARBONATE ANTACID 500 MG PO CHEW: 2.0000 | CHEWABLE_TABLET | ORAL | Status: DC | PRN

## 2023-04-07 MED ORDER — SODIUM CHLORIDE 0.9 % IV BOLUS
1000.0000 mL | Freq: Once | INTRAVENOUS | Status: AC
  Administered 2023-04-07: 1000 mL via INTRAVENOUS

## 2023-04-07 NOTE — ED Notes (Signed)
In to check on pt at this time. Pt c/o cramping pain in her stomach. EDP notified.

## 2023-04-07 NOTE — Discharge Instructions (Signed)
SEIZURE PRECAUTIONS °Per [place] DMV statutes, patients with seizures are not allowed to drive until they have been seizure-free for six months. °  °Use caution when using heavy equipment or power tools. Avoid working on ladders or at heights. Take showers instead of baths. Ensure the water temperature is not too high on the home water heater. Do not go swimming alone. Do not lock yourself in a room alone (i.e. bathroom). When caring for infants or small children, sit down when holding, feeding, or changing them to minimize risk of injury to the child in the event you have a seizure. Maintain good sleep hygiene. Avoid alcohol. °  °If patient has another seizure, call 911 and bring them back to the ED if: °A.  The seizure lasts longer than 5 minutes.      °B.  The patient doesn't wake shortly after the seizure or has new problems such as difficulty seeing, speaking or moving following the seizure °C.  The patient was injured during the seizure °D.  The patient has a temperature over 102 F (39C) °E.  The patient vomited during the seizure and now is having trouble breathing ° ° °

## 2023-04-07 NOTE — H&P (Signed)
HISTORY AND PHYSICAL NOTE  History of Present Illness: Sarah Kennedy is a 22 y.o. [id number] at [redacted]w[redacted]d admitted for new onset seizure.  She presented to the ED this morning via EMS after witnessed ~63m seizure. She denies history of seizure activity, endorses use of marijuana but denies other illicit substances.    Denies history of pre-eclampsia or eclampsia, hypertension, seizure disorder or other pregnancy complications. 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   Supervision of other normal pregnancy, antepartum 02/18/2023    Past Medical History:  Diagnosis Date   Late prenatal care    @32  weeks   Teen pregnancy     Past Surgical History:  Procedure Laterality Date   CHOLECYSTECTOMY      OB History  Gravida Para Term Preterm AB Living  4 2 2   1 2   SAB IAB Ectopic Multiple Live Births  1     0 2    # Outcome Date GA Lbr Len/2nd Weight Sex Delivery Anes PTL Lv  4 Current           3 SAB 2023     SAB     2 Term 10/21/20 [redacted]w[redacted]d  3629 g M Vag-Spont  N LIV  1 Term 11/18/15 [redacted]w[redacted]d / 02:25 3840 g M VBAC, Vacuum Local, EPI  LIV    Social History:  reports that she has never smoked. She has never used smokeless tobacco. She reports that she does not drink alcohol, endorses marijuana use.  Family History: family history includes Clotting disorder in her maternal grandmother; Healthy in her brother, father, maternal grandfather, mother, paternal grandfather, paternal grandmother, and sister.  No Known Allergies    Review of Systems  Constitutional:  Positive for malaise/fatigue.  Respiratory: Negative.    Cardiovascular: Negative.   Gastrointestinal:  Positive for nausea and vomiting.  Genitourinary: Negative.    Neurological:  Positive for seizures.  Psychiatric/Behavioral: Negative.      Physical Examination: Vitals:  BP (!) 95/56 (BP Location: Left Arm)   Pulse 73   Temp 98.6 F (37 C) (Oral)   Resp 16   Ht 5\' 2"  (1.575 m)   Wt 74.8 kg   LMP 12/16/2022 (Approximate)   SpO2 100%   BMI 30.18 kg/m  General: no acute distress.  HEENT: normocephalic, atraumatic Heart: regular rate & rhythm.  No murmurs/rubs/gallops Lungs: clear to auscultation bilaterally, normal respiratory effort Abdomen: soft, gravid, non-tender Extremities: non-tender, without edema  Neurologic: Alert & oriented x 3.    FHT:   148 on BSUS per ED  Labs:  Results for orders placed or performed during the hospital encounter of 04/07/23 (from the past 24 hour(s))  CBC   Collection Time: 04/07/23  9:17 AM  Result Value Ref Range   WBC 10.5 4.0 - 10.5 K/uL   RBC 3.83 (L) 3.87 - 5.11 MIL/uL   Hemoglobin 11.9 (L) 12.0 - 15.0 g/dL   HCT 09.8 (L) 11.9 - 14.7 %   MCV 88.0 80.0 - 100.0 fL   MCH 31.1 26.0 - 34.0 pg   MCHC 35.3 30.0 - 36.0 g/dL   RDW 82.9 56.2 - 13.0 %   Platelets 257 150 - 400 K/uL   nRBC 0.0 0.0 - 0.2 %  Comprehensive metabolic panel   Collection Time: 04/07/23  9:17 AM  Result Value Ref Range   Sodium 132 (L) 135 - 145 mmol/L   Potassium 3.0 (L) 3.5 - 5.1 mmol/L   Chloride 106 98 - 111 mmol/L   CO2 21 (L) 22 - 32 mmol/L   Glucose, Bld 112 (H) 70 - 99 mg/dL   BUN 5 (L) 6 - 20 mg/dL   Creatinine, Ser 1.61 0.44 - 1.00 mg/dL   Calcium 8.3 (L) 8.9 - 10.3 mg/dL   Total Protein 5.9 (L) 6.5 - 8.1 g/dL   Albumin 3.1 (L) 3.5 - 5.0 g/dL   AST 12 (L) 15 - 41 U/L   ALT 10 0 - 44 U/L   Alkaline Phosphatase 38 38 - 126 U/L   Total Bilirubin <0.1 (L) 0.3 - 1.2 mg/dL   GFR, Estimated >09 >60 mL/min   Anion gap 5 5 - 15  Urinalysis, Complete w Microscopic -Urine, Clean Catch   Collection Time: 04/07/23 11:15 AM  Result Value Ref Range   Color, Urine YELLOW (A) YELLOW   APPearance CLOUDY (A) CLEAR    Specific Gravity, Urine 1.014 1.005 - 1.030   pH 7.0 5.0 - 8.0   Glucose, UA NEGATIVE NEGATIVE mg/dL   Hgb urine dipstick NEGATIVE NEGATIVE   Bilirubin Urine NEGATIVE NEGATIVE   Ketones, ur NEGATIVE NEGATIVE mg/dL   Protein, ur 30 (A) NEGATIVE mg/dL   Nitrite NEGATIVE NEGATIVE   Leukocytes,Ua LARGE (A) NEGATIVE   RBC / HPF 6-10 0 - 5 RBC/hpf   WBC, UA 11-20 0 - 5 WBC/hpf   Bacteria, UA FEW (A) NONE SEEN   Squamous Epithelial / HPF 11-20 0 - 5 /HPF   Mucus PRESENT   Urine Drug Screen, Qualitative ([facility] only)   Collection Time: 04/07/23 11:15 AM  Result Value Ref Range   Tricyclic, Ur Screen NONE DETECTED NONE DETECTED   Amphetamines, Ur Screen NONE DETECTED NONE DETECTED   MDMA (Ecstasy)Ur Screen NONE DETECTED NONE DETECTED   Cocaine Metabolite,Ur [place] NONE DETECTED NONE DETECTED   Opiate, Ur Screen NONE DETECTED NONE DETECTED   Phencyclidine (PCP) Ur S NONE DETECTED NONE DETECTED   Cannabinoid 50 Ng, Ur [place] POSITIVE (A) NONE DETECTED   Barbiturates, Ur Screen NONE DETECTED NONE DETECTED   Benzodiazepine, Ur Scrn NONE DETECTED NONE DETECTED   Methadone Scn, Ur NONE DETECTED NONE DETECTED  Protein / creatinine ratio, urine   Collection Time: 04/07/23 11:20 AM  Result Value Ref Range   Creatinine, Urine 110 mg/dL   Total Protein, Urine 17 mg/dL   Protein Creatinine Ratio 0.15 0.00 - 0.15 mg/mg[Cre]       Imaging Studies: CT HEAD WO CONTRAST ( )  Result Date: 04/07/2023 CLINICAL DATA:  Seizure lasting more than 2 minutes. Patient complains of headache and light sensitivity. EXAM: CT HEAD WITHOUT CONTRAST TECHNIQUE: Contiguous axial images were obtained from the base of the skull through the vertex without intravenous contrast. RADIATION DOSE REDUCTION: This exam was performed according to the departmental dose-optimization program which includes automated exposure control, adjustment of the mA and/or kV according to patient size and/or use of iterative reconstruction technique.  COMPARISON:  None Available. FINDINGS: Brain: No evidence of acute infarction, hemorrhage, hydrocephalus, extra-axial collection or mass lesion/mass effect. Vascular: No hyperdense vessel or unexpected calcification. Skull: Normal. Negative for fracture or focal lesion. Sinuses/Orbits: No acute finding. Other: None. IMPRESSION: Normal head CT. Electronically Signed   By: Elberta Fortis M.D.   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 Admit to Antenatal -Neurology consult ordered & Dr. Wilford Corner contacted. -MFM Dr. Grace Bushy contacted, advised given <20w EGA that magnesium is not indicated and to follow neurology recommendations. -FHT qshift doppler -continue PNV  Dr. Feliberto Gottron aware of admission and current plan of care.  Dominica Severin, CNM  Certified Nurse Midwife [facility]

## 2023-04-07 NOTE — Progress Notes (Signed)
Patient ID: Sarah Kennedy, female   DOB: 18-Jan-2001, 22 y.o.   MRN: [id number] Records reviewed . Neurology consult seen . CNM discussed case with MFM Grace Bushy )  Sz activity unlikely to be eclampsia given her gestational age  We will keep on Ob service for now . [facility] will continue care  after EEG tomorrow . Marland Kitchen

## 2023-04-07 NOTE — ED Notes (Signed)
Pt resting comfortably with family at bedside,.

## 2023-04-07 NOTE — ED Provider Notes (Signed)
[facility] Provider Note    Event Date/Time   First MD Initiated Contact with Patient 04/07/23 [phone]     (approximate)  History   Chief Complaint: Seizures  HPI  Sarah Kennedy is a 22 y.o. female approximately [redacted] weeks pregnant who presents to the emergency department after a possible seizure.  According to the patient states this morning her boyfriend tried to wake her up because she was shaking per EMS report.  Patient states she had bit the right side of her tongue and had urinated on herself.  Symptoms are concerning for possible seizure.  Patient denies any history of seizure previously.  Vital signs are reassuring blood pressure currently 91/58.  Patient states she feels somewhat tired but denies any other symptoms.  No headache.  Physical Exam   Triage Vital Signs: ED Triage Vitals  Enc Vitals Group     BP 04/07/23 0915 (!) 91/58     Pulse Rate 04/07/23 0915 77     Resp 04/07/23 0915 18     Temp 04/07/23 0915 98.3 F (36.8 C)     Temp Source 04/07/23 0915 Oral     SpO2 04/07/23 0915 97 %     Weight 04/07/23 0910 165 lb (74.8 kg)     Height 04/07/23 0910 5\' 2"  (1.575 m)     Head Circumference --      Peak Flow --      Pain Score 04/07/23 0907 6     Pain Loc --      Pain Edu? --      Excl. in GC? --     Most recent vital signs: Vitals:   04/07/23 0915  BP: (!) 91/58  Pulse: 77  Resp: 18  Temp: 98.3 F (36.8 C)  SpO2: 97%    General: Awake, no distress.  Very small abrasion to the right side of the patient's tongue. CV:  Good peripheral perfusion.  Regular rate and rhythm  Resp:  Normal effort.  Equal breath sounds bilaterally.  Abd:  No distention.  Soft, nontender.  No rebound or guarding.  ED Results / Procedures / Treatments   EKG  EKG viewed and interpreted by myself shows a sinus rhythm at 76 bpm with a narrow QRS, normal axis, normal intervals, no concerning ST changes.  RADIOLOGY  I have reviewed and interpreted CT  head images.  No bleed or significant abnormality seen on my evaluation. Radiology is read the CT scan is normal.  MEDICATIONS ORDERED IN ED: Medications - No data to display   IMPRESSION / MDM / ASSESSMENT AND PLAN / ED COURSE  I reviewed the triage vital signs and the nursing notes.  Patient's presentation is most consistent with acute presentation with potential threat to life or bodily function.  Patient presents to the emergency department with concerns for possible seizure.  Patient's description of the events of what she remembers at least appear consistent with seizure including tongue abrasion and urinary incontinence.  Patient's basic lab work shows no significant findings, overall reassuring chemistry, CBC reassuring.  Patient receiving IV fluids.  CT scan head shows no concerning findings, urinalysis pending.  However given no prior seizure we will discuss with neurology for further recommendations.  Bedside ultrasound performed by myself shows fetal heart rate of 148 bpm with good fetal motion and activity.  Patient's urinalysis shows few bacteria and 30 protein.  Will cover with Macrobid and send urine culture.  CBC is reassuring, chemistry overall reassuring  as well.  CT scan of the head shows no acute finding.  I spoke with Dr. Wilford Corner of neurology who recommends admission for EEG monitoring.  Would like to hold off on any antiepileptic medications for now until the EEG can be completed.  Discussed the patient with Dr. Feliberto Gottron of OB/GYN, OB will be admitting to the hospital for further workup treatment and neurology consultation.  Discussed this plan of care with the patient who is agreeable.  FINAL CLINICAL IMPRESSION(S) / ED DIAGNOSES   Seizure    Note:  This document was prepared using Dragon voice recognition software and may include unintentional dictation errors.   Minna Antis, MD 04/07/23 1242

## 2023-04-07 NOTE — Consult Note (Addendum)
Neurology Consultation  Reason for Consult: Seizure Referring Physician: Dr. Lenard Lance  CC: Seizure  History is obtained from: Patient, chart  HPI: Sarah Kennedy is a 22 y.o. female no significant past medical history, who is currently [redacted] weeks pregnant, G4 P2-0-1-2, brought in for evaluation of seizure activity.  She does not remember anything as she was sleeping but she says her boyfriend said that in her sleep she started shaking vigorously and she remembers that she probably bit her tongue.  She also had urinary incontinence.  He put her on the side and called EMS.  EMS brought her to the hospital.  Duration of the seizure was about 5 minutes. On arrival, her blood pressures were 91/58 although she felt tired. She was coming to an exam was baseline.  Neurology was consulted for further recommendations.   She denies any prior seizures.  Denies any head injury or history of strokes.  Denies any current stressors.  Reports mild headache.  Says she has been feeling somewhat off the whole week with a little lightheadedness but did not make much of it.  ROS: Full ROS was performed and is negative except as noted in the HPI.   Past Medical History:  Diagnosis Date   Late prenatal care    @32  weeks   Teen pregnancy     Family History  Problem Relation Age of Onset   Healthy Mother    Healthy Father    Healthy Sister    Healthy Brother    Clotting disorder Maternal Grandmother    Healthy Maternal Grandfather    Healthy Paternal Grandmother    Healthy Paternal Grandfather     Social History:   reports that she has never smoked. She has never used smokeless tobacco. She reports that she does not drink alcohol and does not use drugs.  Medications  Current Facility-Administered Medications:    acetaminophen (TYLENOL) tablet 650 mg, 650 mg, Oral, Q4H PRN, Dominica Severin, CNM   calcium carbonate (TUMS - dosed in mg elemental calcium) chewable tablet 400 mg of elemental  calcium, 2 tablet, Oral, Q4H PRN, Dominica Severin, CNM   docusate sodium (COLACE) capsule 100 mg, 100 mg, Oral, Daily, Dominica Severin, CNM, 100 mg at 04/07/23 1347   prenatal multivitamin tablet 1 tablet, 1 tablet, Oral, Q1200, Dominica Severin, CNM, 1 tablet at 04/07/23 1347  Current Outpatient Medications:    acetaminophen (TYLENOL) 325 MG tablet, Take 650 mg by mouth every 6 (six) hours as needed., Disp: , Rfl:    albuterol (VENTOLIN HFA) 108 (90 Base) MCG/ACT inhaler, Inhale 2 puffs into the lungs every 6 (six) hours as needed for wheezing or shortness of breath., Disp: 8 g, Rfl: 2   metoCLOPramide (REGLAN) 10 MG tablet, Take 1 tablet (10 mg total) by mouth every 8 (eight) hours as needed for nausea., Disp: 30 tablet, Rfl: 0   ondansetron (ZOFRAN-ODT) 4 MG disintegrating tablet, Take 4 mg by mouth every 8 (eight) hours as needed for nausea or vomiting., Disp: , Rfl:    Prenatal Vit-Fe Fumarate-FA (PRENATAL MULTIVITAMIN) TABS tablet, Take 1 tablet by mouth daily at 12 noon., Disp: , Rfl:   Exam: Current vital signs: BP 116/65   Pulse 67   Temp 98.2 F (36.8 C) (Oral)   Resp 17   Ht 5\' 2"  (1.575 m)   Wt 74.8 kg   LMP 12/16/2022 (Approximate)   SpO2 99%   BMI 30.18 kg/m  Vital signs in last 24 hours:  Temp:  [98.2 F (36.8 C)-98.3 F (36.8 C)] 98.2 F (36.8 C) (06/30 1349) Pulse Rate:  [67-80] 67 (06/30 1400) Resp:  [11-22] 17 (06/30 1400) BP: (90-116)/(53-65) 116/65 (06/30 1400) SpO2:  [95 %-99 %] 99 % (06/30 1400) Weight:  [74.8 kg] 74.8 kg (06/30 0910)  GENERAL: Awake, alert in NAD HEENT: Tongue bite on the right lateral tongue. LUNGS - Clear to auscultation bilaterally with no wheezes CV - S1S2 RRR, no m/r/g, equal pulses bilaterally. ABDOMEN - Soft, nontender, nondistended with normoactive BS Ext: warm, well perfused, intact peripheral pulses, no edema  NEURO: She is awake although looks a little tired, oriented x 3.  Speech is nondysarthric.  No evidence of  aphasia.  Cranial nerves II to XII intact.  Motor examination shows no drift.  Sensation intact light touch.  Coordination examination reveals no dysmetria.  Gait testing was deferred   Labs I have reviewed labs in epic and the results pertinent to this consultation are: CBC    Component Value Date/Time   WBC 10.5 04/07/2023 0917   RBC 3.83 (L) 04/07/2023 0917   HGB 11.9 (L) 04/07/2023 0917   HGB 12.6 03/12/2023 1518   HCT 33.7 (L) 04/07/2023 0917   HCT 35.9 03/12/2023 1518   PLT 257 04/07/2023 0917   PLT 318 03/12/2023 1518   MCV 88.0 04/07/2023 0917   MCV 87 03/12/2023 1518   MCH 31.1 04/07/2023 0917   MCHC 35.3 04/07/2023 0917   RDW 13.9 04/07/2023 0917   RDW 12.3 03/12/2023 1518   LYMPHSABS 2.1 03/12/2023 1518   MONOABS 1.0 (H) 09/27/2015 1815   EOSABS 0.1 03/12/2023 1518   BASOSABS 0.0 03/12/2023 1518    CMP     Component Value Date/Time   NA 132 (L) 04/07/2023 0917   K 3.0 (L) 04/07/2023 0917   CL 106 04/07/2023 0917   CO2 21 (L) 04/07/2023 0917   GLUCOSE 112 (H) 04/07/2023 0917   BUN 5 (L) 04/07/2023 0917   CREATININE 0.50 04/07/2023 0917   CALCIUM 8.3 (L) 04/07/2023 0917   PROT 5.9 (L) 04/07/2023 0917   ALBUMIN 3.1 (L) 04/07/2023 0917   AST 12 (L) 04/07/2023 0917   ALT 10 04/07/2023 0917   ALKPHOS 38 04/07/2023 0917   BILITOT <0.1 (L) 04/07/2023 0917   GFRNONAA >60 04/07/2023 0917   GFRAA NOT CALCULATED 01/12/2016 2150   U tox positive for cannabinoids  Imaging I have reviewed the images obtained:  CT-head no acute changes   Assessment: 22 year old who is [redacted] weeks pregnant brought into the hospital for concern for 5-minute with a seizure activity in her sleep.  No prior history of seizures. Currently nearly back to baseline although feels a little tired. On examination, neurological examination is intact as she has a right lateral tongue bite. Her labs are essentially unremarkable-she has mild hyponatremia, mild hyperglycemia and anemia.  Her  urinalysis does reveal large leukocyte esterase and few bacteria.  Toxicology screen positive for cannabinoids. Unclear if it is provoked seizure from an infection or cannabis use or some other reason.  Further workup below.  Recommendations: Admit for observation Routine EEG in the morning MRI brain without contrast Seizure precautions No antiepileptics for now given the first-time seizure. Management of UTI per primary team as appropriate. Maintain seizure precautions as below-please instruct her to follow those and not drive per [facility] state unless 6 months seizure-free. I will sign out her care to the oncoming neurologist we will chart check and update the primary  team as appropriate.  If any of those are abnormal, patient will be seen again in follow-up.  She needs to follow-up with outpatient neurology in 2 to 4 weeks-if she is local and prefers to stay in [place] (please provide patient with the phone number to call from their website).  If she wants to follow in [facility] place ambulatory neurology referral in epic, select hospital follow-up-for [facility] in [place].   Plan relayed to Hartley Barefoot, CNM via secure chat.  SEIZURE PRECAUTIONS Per [facility] statutes, patients with seizures are not allowed to drive until they have been seizure-free for six months.   Use caution when using heavy equipment or power tools. Avoid working on ladders or at heights. Take showers instead of baths. Ensure the water temperature is not too high on the home water heater. Do not go swimming alone. Do not lock yourself in a room alone (i.e. bathroom). When caring for infants or small children, sit down when holding, feeding, or changing them to minimize risk of injury to the child in the event you have a seizure. Maintain good sleep hygiene. Avoid alcohol.    If patient has another seizure, call 911 and bring them back to the ED  if: A.  The seizure lasts longer than 5 minutes.      B.  The patient doesn't wake shortly after the seizure or has new problems such as difficulty seeing, speaking or moving following the seizure C.  The patient was injured during the seizure D.  The patient has a temperature over 102 F (39C) E.  The patient vomited during the seizure and now is having trouble breathing   -- Milon Dikes, MD Neurologist [facility] Pager: [phone]

## 2023-04-07 NOTE — ED Notes (Signed)
Advised nurse that patient has ready bed 

## 2023-04-07 NOTE — ED Triage Notes (Signed)
Pt in via [facility] for a witnessed seizure that lasted more than 2 minutes. Pt urinated during seizure and bit tongue. Pt c/o headache and light sensitivity per EMS. Pt states she has a headache but denies light sensitivity. Pt is A&O x4  Pt is [redacted] wks pregnant. 5th pregnancy. no hx of pre eclampsia.Pt seen at [facility] for prenatal care.  Vitals Per EMS: BP-136/78 BS-124 T-98.2

## 2023-04-08 ENCOUNTER — Other Ambulatory Visit: Payer: Self-pay | Admitting: Obstetrics

## 2023-04-08 ENCOUNTER — Ambulatory Visit: Payer: Medicaid Other

## 2023-04-08 DIAGNOSIS — O99352 Diseases of the nervous system complicating pregnancy, second trimester: Secondary | ICD-10-CM | POA: Diagnosis not present

## 2023-04-08 DIAGNOSIS — R569 Unspecified convulsions: Secondary | ICD-10-CM | POA: Diagnosis not present

## 2023-04-08 DIAGNOSIS — G40909 Epilepsy, unspecified, not intractable, without status epilepticus: Secondary | ICD-10-CM

## 2023-04-08 DIAGNOSIS — Z3A16 16 weeks gestation of pregnancy: Secondary | ICD-10-CM

## 2023-04-08 LAB — URINE CULTURE: Culture: <10000 — AB

## 2023-04-08 MED ORDER — LEVETIRACETAM 500 MG PO TABS
500.0000 mg | ORAL_TABLET | Freq: Two times a day (BID) | ORAL | Status: DC
  Filled 2023-04-08: qty 1

## 2023-04-08 MED ORDER — LEVETIRACETAM 500 MG PO TABS: 500.0000 mg | ORAL_TABLET | Freq: Two times a day (BID) | ORAL | 3 refills | Status: DC

## 2023-04-08 MED ORDER — LEVETIRACETAM ER 500 MG PO TB24: 500.0000 mg | ORAL_TABLET | Freq: Two times a day (BID) | ORAL | Status: DC

## 2023-04-08 NOTE — Procedures (Addendum)
Patient Name: Sarah Kennedy  MRN: [id number]  Epilepsy Attending: Charlsie Quest  Referring Physician/Provider: Karolee Ohs, RN  Date: 04/08/2023 Duration: 27.38 mins  Patient history: 22 y.o. female no significant past medical history, who is currently [redacted] weeks pregnant, G4 P2-0-1-2, brought in for evaluation of seizure activity. EEG to evaluate for seizure.  Level of alertness: Awake, asleep  AEDs during EEG study: None  Technical aspects: This EEG study was done with scalp electrodes positioned according to the 10-20 International system of electrode placement. Electrical activity was reviewed with band pass filter of 1-70Hz , sensitivity of 7 uV/mm, display speed of 29mm/sec with a 60Hz  notched filter applied as appropriate. EEG data were recorded continuously and digitally stored.  Video monitoring was available and reviewed as appropriate.  Description: The posterior dominant rhythm consists of 8-9 Hz activity of moderate voltage (25-35 uV) seen predominantly in posterior head regions, symmetric and reactive to eye opening and eye closing. Sleep was characterized by vertex waves, sleep spindles (12 to 14 Hz), maximal frontocentral region. Abundant sharp waves were noted in right centro-temporal region, maximal T8/F8>C4. Hyperventilation and photic stimulation were not performed.     ABNORMALITY -Sharp wave, right temporal region  IMPRESSION: This study showed evidence of epileptogenicity arising from right centro-temporal region. No seizures were seen throughout the recording.  Dr. Iver Nestle was notified.     [name] Annabelle Harman

## 2023-04-08 NOTE — Discharge Summary (Addendum)
Physician Discharge Summary  Patient ID: Sarah Kennedy MRN: [id number] DOB/AGE: 01/20/2001 22 y.o.  Admit date: 04/07/2023 Discharge date: 04/08/2023  Admission Diagnoses:  Discharge Diagnoses:  Principal Problem:   Seizure disorder during pregnancy [facility])   Discharged Condition: good  Hospital Course: The patient was admitted to the [facility] service after ED evaluation for one seizure episode on 04/07/2023. She had lab work, and MRI and CT of the head performed that did not reveal any abnormal findings. An EEG performed on 04/08/2023 does indicate evidence of epileptogenicity arising from the right centro temporal region. Neurotology was consulted and recommended the patient be started on Keppra 500 mg po BID. The patient had no seizure activity during her overnight stay on the Postpartum unit. She was thus discharged home on Seizure precautions (no driving for 6 months, no swimming alone, and sitting only when caring for young children). She has been referred to Neuorology for out- patient follow up.  Consults: neurology See notes from Neuro  Significant Diagnostic Studies: MRI, CT,  and EEG, Reports available in EPIC.   Treatments: IV hydration, Seizure precautions and ongoing tele sitting once admitted. Started on Keppra  Discharge Exam: Blood pressure (!) 94/58, pulse 71, temperature 98.9 F (37.2 C), temperature source Oral, resp. rate 16, height 5\' 2"  (1.575 m), weight 74.8 kg, last menstrual period 12/16/2022, SpO2 99 %. Physical Exam Vitals and nursing note reviewed.  Constitutional:      Appearance: Normal appearance. She is normal weight.  HENT:     Head: Normocephalic and atraumatic.  Eyes:     Extraocular Movements: Extraocular movements intact.     Pupils: Pupils are equal, round, and reactive to light.  Cardiovascular:     Rate and Rhythm: Normal rate and regular rhythm.     Pulses: Normal pulses.     Heart sounds: Normal heart sounds.  Pulmonary:     Effort: Pulmonary  effort is normal.     Breath sounds: Normal breath sounds.  Abdominal:     General: Abdomen is flat.     Palpations: Abdomen is soft.  Genitourinary:    Comments: deferred Musculoskeletal:        General: Normal range of motion.     Cervical back: Normal range of motion and neck supple.  Skin:    General: Skin is warm and dry.  Neurological:     General: No focal deficit present.     Mental Status: She is oriented to person, place, and time.  Psychiatric:        Mood and Affect: Mood normal.        Behavior: Behavior normal.   FHTS via doppler- 140s   Disposition:  Discharge disposition: 01-Home or Self Care     Home in care of husband and family. Seizure guidelines  Discharge Instructions     Discharge activity:   Complete by: As directed    Notify physician for a general feeling that "something is not right"   Complete by: As directed    Notify physician for increase or change in vaginal discharge   Complete by: As directed    Notify physician for intestinal cramps, with or without diarrhea, sometimes described as "gas pain"   Complete by: As directed    Notify physician for leaking of fluid   Complete by: As directed    Notify physician for low, dull backache, unrelieved by heat or Tylenol   Complete by: As directed    Notify physician for menstrual like cramps  Complete by: As directed    Notify physician for pelvic pressure   Complete by: As directed    Notify physician for uterine contractions.  These may be painless and feel like the uterus is tightening or the baby is  "balling up"   Complete by: As directed    Notify physician for vaginal bleeding   Complete by: As directed    PRETERM LABOR:  Includes any of the follwing symptoms that occur between 20 - [redacted] weeks gestation.  If these symptoms are not stopped, preterm labor can result in preterm delivery, placing your baby at risk   Complete by: As directed       Allergies as of 04/08/2023   No Known  Allergies      Medication List     TAKE these medications    acetaminophen 325 MG tablet Commonly known as: TYLENOL Take 650 mg by mouth every 6 (six) hours as needed.   albuterol 108 (90 Base) MCG/ACT inhaler Commonly known as: VENTOLIN HFA Inhale 2 puffs into the lungs every 6 (six) hours as needed for wheezing or shortness of breath.   levETIRAcetam 500 MG tablet Commonly known as: KEPPRA Take 1 tablet (500 mg total) by mouth 2 (two) times daily.   metoCLOPramide 10 MG tablet Commonly known as: REGLAN Take 1 tablet (10 mg total) by mouth every 8 (eight) hours as needed for nausea.   ondansetron 4 MG disintegrating tablet Commonly known as: ZOFRAN-ODT Take 4 mg by mouth every 8 (eight) hours as needed for nausea or vomiting.   prenatal multivitamin Tabs tablet Take 1 tablet by mouth daily at 12 noon.       She is instructed to follow up at [facility] for her next OB visit. A referral to Neuorology has been made.  Signed: Mirna Mires 04/08/2023, 6:25 PM

## 2023-04-08 NOTE — Progress Notes (Addendum)
Neurology Progress Note  Patient ID: Sarah Kennedy is a 22 y.o. with PMHx of  has a past medical history of Late prenatal care and Teen pregnancy.  Initially consulted for: c/f new onset seizure   Subjective: - No new complaints, eager to go home  Exam: Vitals:   04/08/23 0456 04/08/23 0820  BP: (!) 93/55 (!) 95/55  Pulse: (!) 57 63  Resp: 18 18  Temp: 98.2 F (36.8 C) 98.4 F (36.9 C)  SpO2: 100% 100%   Gen: In bed, comfortable  Resp: non-labored breathing, no grossly audible wheezing Cardiac: Perfusing extremities well  Abd: soft, nt  Neuro: MS: Awake, alert, conversant, fluent speech, follows simple commands, oriented to situation, good attention/concentration CN: Tracks examiner in all directions, face symmetric, tongue midline, pupils equal round reactive Motor: No pronator drift Sensory: Intact to light touch in all 4   Pertinent Labs:  Basic Metabolic Panel: Recent Labs  Lab 04/07/23 0917  NA 132*  K 3.0*  CL 106  CO2 21*  GLUCOSE 112*  BUN 5*  CREATININE 0.50  CALCIUM 8.3*    CBC: Recent Labs  Lab 04/07/23 0917  WBC 10.5  HGB 11.9*  HCT 33.7*  MCV 88.0  PLT 257    Coagulation Studies: No results for input(s): "LABPROT", "INR" in the last 72 hours.    MRI brain personally reviewed, agree with radiology no acute intracranial process, no clear seizure focus  Impression: Single event concerning for seizure, potentially with seizure threshold lowered by sleep deprivation though fairly minimal amount of sleep deprivation (went to bed at 2:30 in the morning instead of her typical 11 PM or midnight on the night of the event).  If no EEG abnormalities, overall risk of recurrence would be low and therefore at risk of starting antiseizure medications would not be outweighed by benefit.  However patient should have close follow-up with outpatient neurology even if EEG is negative as a second event would very likely require initiation of antiseizure  medications which in the setting of pregnancy would need close monitoring for side effects and potential dose adjustments  Recommendations: -EEG results pending, if there is evidence of interictal abnormalities, would start Keppra 500 mg twice daily, otherwise I will hold off on antiseizure medications -Patient counseled on potential side effects of Keppra including increased irritability/anxiety.  If she experiences these would consider bridging with Keppra to therapeutic lamotrigine  -Patient prefers outpatient follow-up with [facility] neurology Dr. Sherryll Burger, should be given number to call for appointment [phone] -Regardless of EEG results, seizure precautions should be included in discharge instructions, I personally reviewed these instructions at bedside with patient today -Neurology will follow-up EEG, but otherwise will sign off at this time.  Please reach out if additional questions or concerns arise.  Recommendations conveyed to primary team via secure chat   Standard seizure precautions: Per [facility] statutes, patients with seizures are not allowed to drive until  they have been seizure-free for six months. Use caution when using heavy equipment or power tools. Avoid working on ladders or at heights. Take showers instead of baths. Ensure the water temperature is not too high on the home water heater. Do not go swimming alone. When caring for infants or small children, sit down when holding, feeding, or changing them to minimize risk of injury to the child in the event you have a seizure.  To reduce risk of seizures, maintain good sleep hygiene avoid alcohol and illicit drug use, take all  anti-seizure medications as prescribed.    Addendum, EEG with right temporal sharps reported to me by Dr. Melynda Ripple, personal review cannot be completed due to connectivity issues.  Start Keppra as detailed above (500 BID). Please ensure patients pharmacy has medication available and she will be  able to pick up today or ensure patient has medications in hand prior to discharge.    [name] MD-PhD [facility] [phone]   Greater than 50 minutes spent in care of this patient, majority at bedside

## 2023-04-08 NOTE — Progress Notes (Signed)
Patient discharged. Discharge instructions given. Patient verbalizes understanding. Patient walked of unit with discharge instructions in hand.

## 2023-04-08 NOTE — Final Progress Note (Signed)
Physician Final Progress Note  Patient ID: Sarah Kennedy MRN: [id number] DOB/AGE: Dec 14, 2000 22 y.o.  Admit date: 04/07/2023 Admitting provider: Hildred Laser, MD Discharge date: 04/08/2023   Admission Diagnoses: seizure during pregnancy in second trimester [redacted] week gestation of pregnancy  Discharge Diagnoses:  Principal Problem:   Seizure disorder in pregnancy, antepartum, second trimester (HCC)  [redacted] week gestation  Consults: neurology  Significant Findings/ Diagnostic Studies: EEG  revealed evidence of epileptogenicity arising from the right centro-temporal region.  Procedures: CT scan, MRI, EEG  Discharge Condition: good  Disposition: Discharge disposition: 01-Home or Self Care       Diet: Regular diet  Discharge Activity: Sesizure guidelines including no driving for 6 months, no swimming alone, and  when caring for young children, to remain sitting.  Discharge Instructions     Discharge activity:   Complete by: As directed    Notify physician for a general feeling that "something is not right"   Complete by: As directed    Notify physician for increase or change in vaginal discharge   Complete by: As directed    Notify physician for intestinal cramps, with or without diarrhea, sometimes described as "gas pain"   Complete by: As directed    Notify physician for leaking of fluid   Complete by: As directed    Notify physician for low, dull backache, unrelieved by heat or Tylenol   Complete by: As directed    Notify physician for menstrual like cramps   Complete by: As directed    Notify physician for pelvic pressure   Complete by: As directed    Notify physician for uterine contractions.  These may be painless and feel like the uterus is tightening or the baby is  "balling up"   Complete by: As directed    Notify physician for vaginal bleeding   Complete by: As directed    PRETERM LABOR:  Includes any of the follwing symptoms that occur between 20 - [redacted] weeks  gestation.  If these symptoms are not stopped, preterm labor can result in preterm delivery, placing your baby at risk   Complete by: As directed       Allergies as of 04/08/2023   No Known Allergies      Medication List     TAKE these medications    acetaminophen 325 MG tablet Commonly known as: TYLENOL Take 650 mg by mouth every 6 (six) hours as needed.   albuterol 108 (90 Base) MCG/ACT inhaler Commonly known as: VENTOLIN HFA Inhale 2 puffs into the lungs every 6 (six) hours as needed for wheezing or shortness of breath.   levETIRAcetam 500 MG tablet Commonly known as: KEPPRA Take 1 tablet (500 mg total) by mouth 2 (two) times daily.   metoCLOPramide 10 MG tablet Commonly known as: REGLAN Take 1 tablet (10 mg total) by mouth every 8 (eight) hours as needed for nausea.   ondansetron 4 MG disintegrating tablet Commonly known as: ZOFRAN-ODT Take 4 mg by mouth every 8 (eight) hours as needed for nausea or vomiting.   prenatal multivitamin Tabs tablet Take 1 tablet by mouth daily at 12 noon.        Follow-up Information     [facility] Follow up.   Why: Call and make a follow up appointment with Dr. Sherryll Burger at [facility]. Contact information: [address] [place] [address] [phone]  Total time spent taking care of this patient: 45 minutes in direct patient care, reviewing records and documentation.  Signed: Mirna Mires 04/08/2023, 6:45 PM

## 2023-04-08 NOTE — Procedures (Signed)
Patient will have eeg approx 1pm-have another patient on the way.

## 2023-04-08 NOTE — Progress Notes (Signed)
Daily Antepartum Note  Admission Date: 04/07/2023 Current Date: 04/08/2023 11:44 AM  Sarah Kennedy is a 22 y.o. [id number] @ [redacted]w[redacted]d by ultrasound, , admitted for evaluation after suffering a seizure while at home. 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  Patient Active Problem List   Diagnosis Date Noted   Seizure disorder during pregnancy (HCC) 04/07/2023   Uterine fibroid in pregnancy 04/07/2023   Marijuana use during pregnancy 04/07/2023   Supervision of other normal pregnancy, antepartum 02/18/2023    Overnight/24hr events:  No seizure activity. She has largely rested. FHTs 140s via doppler. No nausea or vomiting BP (!) 95/55 (BP Location: Left Arm)   Pulse 63   Temp 98.4 F (36.9 C) (Oral)   Resp 18   Ht 5\' 2"  (1.575 m)   Wt 74.8 kg   LMP 12/16/2022 (Approximate)   SpO2 100%   BMI 30.18 kg/m    Subjective:  IUP 16 weeks 1 day to [place] 4 para 2. She denies any headache, visual changes or swelling. Feels slightly sleepy.  Objective:   Vitals:   04/08/23 0456 04/08/23 0820  BP: (!) 93/55 (!) 95/55  Pulse: (!) 57 63  Resp: 18 18  Temp: 98.2 F (36.8 C) 98.4 F (36.9 C)  SpO2: 100% 100%   Temp:  [98.2 F (36.8 C)-98.6 F (37 C)] 98.4 F (36.9 C) (07/01 0820) Pulse Rate:  [55-73] 63 (07/01 0820) Resp:  [11-20] 18 (07/01 0820) BP: (89-116)/(49-65) 95/55 (07/01 0820) SpO2:  [98 %-100 %] 100 % (07/01 0820) Temp (24hrs), Avg:98.3 F (36.8 C), Min:98.2 F (36.8 C), Max:98.6 F (37 C)   Intake/Output Summary (Last 24 hours) at 04/08/2023 1144 Last data filed at 04/07/2023 2059 Gross per 24 hour  Intake --  Output 200 ml  Net -200 ml     Current Vital Signs 24h Vital Sign Ranges  T 98.4 F (36.9 C) Temp  Avg: 98.3 F (36.8 C)  Min: 98.2 F (36.8 C)  Max: 98.6 F (37 C)  BP (!) 95/55 BP  Min:  89/51  Max: 116/65  HR 63 Pulse  Avg: 62.6  Min: 55  Max: 73  RR 18 Resp  Avg: 17  Min: 11  Max: 20  SaO2 100 % Room Air SpO2  Avg: 99.6 %  Min: 98 %  Max: 100 %       24 Hour I/O Current Shift I/O  Time Ins Outs 06/30 0701 - 07/01 0700 In: -  Out: 200 [Urine:200] No intake/output data recorded.   Patient Vitals for the past 24 hrs:  BP Temp Temp src Pulse Resp SpO2  04/08/23 0820 (!) 95/55 98.4 F (36.9 C) Oral 63 18 100 %  04/08/23 0456 (!) 93/55 98.2 F (36.8 C) Oral (!) 57 18 100 %  04/08/23 0048 (!) 93/49 98.4 F (36.9 C) Oral (!) 55 20 100 %  04/07/23 1935 (!) 96/58 98.3 F (36.8 C) Oral (!) 59 18 100 %  04/07/23 1933 -- -- -- 64 -- 100 %  04/07/23 1658 (!) 97/51 -- -- (!) 57 -- --  04/07/23 1655 (!) 89/51 98.3 F (36.8 C) Oral 64 18 98 %  04/07/23 1514 (!) 95/56 98.6 F (37 C) Oral 73 16 100 %  04/07/23 1400 116/65 -- -- 67 17 99 %  04/07/23 1349 -- 98.2  F (36.8 C) Oral -- -- --  04/07/23 1200 (!) 91/53 -- -- 67 11 99 %    Physical exam: General: Well nourished, well developed female in no acute distress. Slightly sleepy per her report. Abdomen: gravid FHTs 140s this morning Cardiovascular: S1, S2 normal, no murmur, rub or gallop, regular rate and rhythm Respiratory: CTAB Extremities: no clubbing, cyanosis or edema Skin: Warm and dry.   Medications: Current Facility-Administered Medications  Medication Dose Route Frequency Provider Last Rate Last Admin   acetaminophen (TYLENOL) tablet 650 mg  650 mg Oral Q4H PRN Dominica Severin, CNM       calcium carbonate (TUMS - dosed in mg elemental calcium) chewable tablet 400 mg of elemental calcium  2 tablet Oral Q4H PRN Dominica Severin, CNM       docusate sodium (COLACE) capsule 100 mg  100 mg Oral Daily Hartley Barefoot L, CNM   100 mg at 04/08/23 1042   ondansetron (ZOFRAN) injection 4 mg  4 mg Intravenous Q4H PRN Dominica Severin, CNM   4 mg at 04/07/23 1545   prenatal multivitamin tablet 1 tablet  1  tablet Oral Q1200 Dominica Severin, CNM   1 tablet at 04/08/23 1042    Labs:  Recent Labs  Lab 04/07/23 0917  WBC 10.5  HGB 11.9*  HCT 33.7*  PLT 257    Recent Labs  Lab 04/07/23 0917  NA 132*  K 3.0*  CL 106  CO2 21*  BUN 5*  CREATININE 0.50  CALCIUM 8.3*  PROT 5.9*  BILITOT <0.1*  ALKPHOS 38  ALT 10  AST 12*  GLUCOSE 112*       Assessment & Plan:  22 year old Gravida 4 Para 2 at 16 weeks 1 day gesttation Episode of seizure at home on 04/07/2023 Has been evaluated by Neuro Hospitalist. Negative MRI Awaiting EEG  Pregnancy noted for chronic marijuana use N and V in pregnancy, largely resolving. Will consider discharge home today on Seizure precautions based on EEG results.  Discussed patient's marijuana use and strongly encouraged her to refrain from any further exposure during this pregnancy.  Mirna Mires, CNM  04/08/2023 11:56 AM \ Mirna Mires, CNM  04/08/2023 11:44 AM

## 2023-04-08 NOTE — Progress Notes (Signed)
Eeg done 

## 2023-04-23 ENCOUNTER — Ambulatory Visit (INDEPENDENT_AMBULATORY_CARE_PROVIDER_SITE_OTHER): Payer: Medicaid Other | Admitting: Licensed Practical Nurse

## 2023-04-23 ENCOUNTER — Encounter: Payer: Self-pay | Admitting: Licensed Practical Nurse

## 2023-04-23 VITALS — BP 89/54 | HR 86 | Wt 167.3 lb

## 2023-04-23 DIAGNOSIS — D259 Leiomyoma of uterus, unspecified: Secondary | ICD-10-CM

## 2023-04-23 DIAGNOSIS — Z3480 Encounter for supervision of other normal pregnancy, unspecified trimester: Secondary | ICD-10-CM

## 2023-04-23 DIAGNOSIS — O3412 Maternal care for benign tumor of corpus uteri, second trimester: Secondary | ICD-10-CM

## 2023-04-23 DIAGNOSIS — Z3A18 18 weeks gestation of pregnancy: Secondary | ICD-10-CM

## 2023-04-23 LAB — POCT URINALYSIS DIPSTICK
Bilirubin, UA: NEGATIVE
Blood, UA: NEGATIVE
Glucose, UA: NEGATIVE
Ketones, UA: NEGATIVE
Leukocytes, UA: NEGATIVE
Nitrite, UA: NEGATIVE
Protein, UA: NEGATIVE
Spec Grav, UA: 1.015 (ref 1.010–1.025)
Urobilinogen, UA: 0.2 E.U./dL
pH, UA: 7 (ref 5.0–8.0)

## 2023-04-23 NOTE — Progress Notes (Signed)
Routine Prenatal Care Visit  Subjective  Sarah Kennedy is a 22 y.o. [phone] at [redacted]w[redacted]d being seen today for ongoing prenatal care.  She is currently monitored for the following issues for this high-risk pregnancy and has Supervision of other normal pregnancy, antepartum; Seizure disorder in pregnancy, antepartum, second trimester (HCC); Uterine fibroid in pregnancy; and Marijuana use during pregnancy on their problem list.  ----------------------------------------------------------------------------------- Patient reports no complaints.  Doing ok. Mood has "been all over the place". Here with Ihor Gully, he will be moving to [place] soon, he has a 57 year old son.  Pt and her partner a little upset, they were told an [place] would happen today. Offered apology. [place] has not been ordered.  [place] ordered. Ihor Gully asked about IC during pregnancy, reassured it is safe.  -has been taking Sheralyn Boatman, has not yet scheduled apt with Neuro, reminded to do so.   Contractions: Not present. Vag. Bleeding: None.  Movement: Present. Leaking Fluid denies.  ----------------------------------------------------------------------------------- The following portions of the patient's history were reviewed and updated as appropriate: allergies, current medications, past family history, past medical history, past social history, past surgical history and problem list. Problem list updated.  Objective  Blood pressure (!) 89/54, pulse 86, weight 167 lb 4.8 oz (75.9 kg), last menstrual period 12/16/2022. Pregravid weight 160 lb (72.6 kg) Total Weight Gain 7 lb 4.8 oz (3.311 kg) Urinalysis: Urine Protein    Urine Glucose    Fetal Status: Fetal Heart Rate (bpm): 140   Movement: Present     General:  Alert, oriented and cooperative. Patient is in no acute distress.  Skin: Skin is warm and dry. No rash noted.   Cardiovascular: Normal heart rate noted  Respiratory: Normal respiratory effort, no problems with respiration noted  Abdomen:  Soft, gravid, appropriate for gestational age. Pain/Pressure: Present     Pelvic:  Cervical exam deferred        Extremities: Normal range of motion.  Edema: None  Mental Status: Normal mood and affect. Normal behavior. Normal judgment and thought content.   Assessment   22 y.o. [id number] at [redacted]w[redacted]d by  09/22/2023, by Last Menstrual Period presenting for routine prenatal visit  Plan   fourth Problems (from 02/18/23 to present)     Problem Noted Resolved   Uterine fibroid in pregnancy 04/07/2023 by Dominica Severin, CNM No   Supervision of other normal pregnancy, antepartum 02/18/2023 by Loran Senters, CMA No   Overview Addendum 02/18/2023 11:00 AM by Loran Senters, CMA     Clinical Staff Provider  Office Location  [facility] Dating  Not found.  Language  English Anatomy US    Flu Vaccine  offer Genetic Screen  NIPS:   TDaP vaccine   offer Hgb A1C or  GTT Early : Third trimester :   Covid declined   LAB RESULTS   Rhogam     Blood Type     Feeding Plan undecided Antibody    Contraception undecided Rubella    Circumcision yes RPR     Pediatrician  Phineas Real [facility]     Support Person Ihor Gully HIV    Prenatal Classes yes Varicella     GBS  (For PCN allergy, check sensitivities)   BTL Consent  Hep C     VBAC Consent  Pap No results found for: "DIAGPAP"    Hgb Electro      CF      SMA  Preterm labor symptoms and general obstetric precautions including but not limited to vaginal bleeding, contractions, leaking of fluid and fetal movement were reviewed in detail with the patient. Please refer to After Visit Summary for other counseling recommendations.   Return in about 4 weeks (around 05/21/2023) for ROB. Anatomy US ordered   Jannifer Hick   [facility]  04/23/23  11:23 AM '

## 2023-05-07 ENCOUNTER — Ambulatory Visit (INDEPENDENT_AMBULATORY_CARE_PROVIDER_SITE_OTHER): Payer: Medicaid Other

## 2023-05-07 DIAGNOSIS — Z3689 Encounter for other specified antenatal screening: Secondary | ICD-10-CM

## 2023-05-07 DIAGNOSIS — Z3480 Encounter for supervision of other normal pregnancy, unspecified trimester: Secondary | ICD-10-CM

## 2023-05-07 DIAGNOSIS — Z3A20 20 weeks gestation of pregnancy: Secondary | ICD-10-CM | POA: Diagnosis not present

## 2023-05-21 ENCOUNTER — Other Ambulatory Visit: Payer: Self-pay

## 2023-05-21 ENCOUNTER — Other Ambulatory Visit: Payer: Medicaid Other

## 2023-05-21 ENCOUNTER — Ambulatory Visit: Payer: Medicaid Other | Admitting: Obstetrics & Gynecology

## 2023-05-21 ENCOUNTER — Encounter: Payer: Medicaid Other | Admitting: Certified Nurse Midwife

## 2023-05-21 VITALS — BP 97/60 | HR 70 | Wt 166.0 lb

## 2023-05-21 DIAGNOSIS — O09892 Supervision of other high risk pregnancies, second trimester: Secondary | ICD-10-CM | POA: Insufficient documentation

## 2023-05-21 DIAGNOSIS — Z3A22 22 weeks gestation of pregnancy: Secondary | ICD-10-CM

## 2023-05-21 DIAGNOSIS — O4442 Low lying placenta NOS or without hemorrhage, second trimester: Secondary | ICD-10-CM | POA: Insufficient documentation

## 2023-05-21 DIAGNOSIS — D259 Leiomyoma of uterus, unspecified: Secondary | ICD-10-CM

## 2023-05-21 DIAGNOSIS — O3410 Maternal care for benign tumor of corpus uteri, unspecified trimester: Secondary | ICD-10-CM

## 2023-05-21 DIAGNOSIS — Z362 Encounter for other antenatal screening follow-up: Secondary | ICD-10-CM | POA: Diagnosis not present

## 2023-05-21 DIAGNOSIS — O99352 Diseases of the nervous system complicating pregnancy, second trimester: Secondary | ICD-10-CM

## 2023-05-21 DIAGNOSIS — G40909 Epilepsy, unspecified, not intractable, without status epilepticus: Secondary | ICD-10-CM

## 2023-05-21 DIAGNOSIS — R87612 Low grade squamous intraepithelial lesion on cytologic smear of cervix (LGSIL): Secondary | ICD-10-CM

## 2023-05-21 NOTE — Progress Notes (Signed)
Subjective:    Sarah Kennedy is a 22 y.o. P2 being seen today for her obstetrical visit. She is at [redacted]w[redacted]d gestation. Patient reports no complaints. Fetal movement: normal. She saw her neurologist last week, has not had any seizures since June 2024. She has a follow up next month.  She denies bleeding, ROM, or contractions.   Review of Systems:   Review of Systems   Objective:    BP 97/60   Pulse 70   Wt 166 lb (75.3 kg)   LMP 12/16/2022 (Approximate)   BMI 30.36 kg/m   Physical Exam  Exam Well nourished, well hydrated Black female, no apparent distress   FHT:  130s  Uterine Size: 23 cm  Abd- benign, gravid      Assessment:    Pregnancy:  [id number]    Plan:    Patient Active Problem List   Diagnosis Date Noted   LGSIL on Pap smear of cervix 05/21/2023   Low lying placenta nos or without hemorrhage, second trimester 05/21/2023   Supervision of other high risk pregnancies, second trimester 05/21/2023   Seizure disorder in pregnancy, antepartum, second trimester (HCC) 04/07/2023   Uterine fibroid in pregnancy 04/07/2023   Marijuana use during pregnancy 04/07/2023   Supervision of other normal pregnancy, antepartum 02/18/2023    - repeat pap post partum - repeat ultrasound in 4 weeks to follow low lying placenta and AFI - she has increased folic acid per neurologist recommendation and will have f/u there in 4 weeks - glucola/labs in 4 weeks

## 2023-06-12 ENCOUNTER — Other Ambulatory Visit: Payer: Self-pay

## 2023-06-12 DIAGNOSIS — Z3A28 28 weeks gestation of pregnancy: Secondary | ICD-10-CM

## 2023-06-12 NOTE — Addendum Note (Signed)
Addended by: Burtis Junes on: 06/12/2023 02:35 PM   Modules accepted: Orders

## 2023-06-18 ENCOUNTER — Ambulatory Visit (INDEPENDENT_AMBULATORY_CARE_PROVIDER_SITE_OTHER): Payer: Medicaid Other

## 2023-06-18 ENCOUNTER — Ambulatory Visit (INDEPENDENT_AMBULATORY_CARE_PROVIDER_SITE_OTHER): Payer: Medicaid Other | Admitting: Obstetrics & Gynecology

## 2023-06-18 ENCOUNTER — Other Ambulatory Visit (HOSPITAL_COMMUNITY)
Admission: RE | Admit: 2023-06-18 | Discharge: 2023-06-18 | Disposition: A | Discharge status: Home / Self Care | Payer: Medicaid Other | Source: Ambulatory Visit | Attending: Obstetrics & Gynecology | Admitting: Obstetrics & Gynecology

## 2023-06-18 ENCOUNTER — Encounter: Payer: Self-pay | Admitting: Obstetrics & Gynecology

## 2023-06-18 ENCOUNTER — Other Ambulatory Visit: Payer: Medicaid Other

## 2023-06-18 VITALS — BP 100/49 | HR 81 | Wt 166.0 lb

## 2023-06-18 DIAGNOSIS — Z3A26 26 weeks gestation of pregnancy: Secondary | ICD-10-CM

## 2023-06-18 DIAGNOSIS — O26892 Other specified pregnancy related conditions, second trimester: Secondary | ICD-10-CM | POA: Diagnosis present

## 2023-06-18 DIAGNOSIS — O99210 Obesity complicating pregnancy, unspecified trimester: Secondary | ICD-10-CM | POA: Insufficient documentation

## 2023-06-18 DIAGNOSIS — N898 Other specified noninflammatory disorders of vagina: Secondary | ICD-10-CM

## 2023-06-18 DIAGNOSIS — O4442 Low lying placenta NOS or without hemorrhage, second trimester: Secondary | ICD-10-CM

## 2023-06-18 DIAGNOSIS — E669 Obesity, unspecified: Secondary | ICD-10-CM

## 2023-06-18 DIAGNOSIS — O09892 Supervision of other high risk pregnancies, second trimester: Secondary | ICD-10-CM

## 2023-06-18 DIAGNOSIS — O99212 Obesity complicating pregnancy, second trimester: Secondary | ICD-10-CM

## 2023-06-18 MED ORDER — FLUCONAZOLE 150 MG PO TABS: 150.0000 mg | ORAL_TABLET | Freq: Once | ORAL | 0 refills | Status: AC

## 2023-06-18 NOTE — Progress Notes (Signed)
   PRENATAL VISIT NOTE  Subjective:  Sarah Kennedy is a 22 y.o. [phone] at [redacted]w[redacted]d being seen today for ongoing prenatal care.  She is currently monitored for the following issues for this high-risk pregnancy and has Seizure disorder in pregnancy, antepartum, second trimester (HCC); Uterine fibroid in pregnancy; Marijuana use during pregnancy; LGSIL on Pap smear of cervix; Low lying placenta nos or without hemorrhage, second trimester; Supervision of other high risk pregnancies, second trimester; and Obesity in pregnancy on their problem list.  Patient reports  vaginal discharge for about a week . She has had BV in the past. She has not used any OTC meds at this point. Contractions: Irritability. Vag. Bleeding: None.  Movement: Present. Denies leaking of fluid.   The following portions of the patient's history were reviewed and updated as appropriate: allergies, current medications, past family history, past medical history, past social history, past surgical history and problem list.   Objective:   Vitals:   06/18/23 1026  BP: (!) 100/49  Pulse: 81  Weight: 166 lb (75.3 kg)  FH- 26 cm FHR- 130s  Fetal Status:     Movement: Present     General:  Alert, oriented and cooperative. Patient is in no acute distress.  Skin: Skin is warm and dry. No rash noted.   Cardiovascular: Normal heart rate noted  Respiratory: Normal respiratory effort, no problems with respiration noted  Abdomen: Soft, gravid, appropriate for gestational age.  Pain/Pressure: Absent     Pelvic: Cervical exam deferred        Extremities: Normal range of motion.     Mental Status: Normal mood and affect. Normal behavior. Normal judgment and thought content.  Spec exam reveals a discharge c/w yeast (curdy white). There is also a greenish tint to viscous clear discharge noted at the cervical os.  Assessment and Plan:  Pregnancy: [id number] at [redacted]w[redacted]d 1. Obesity in pregnancy - good TWG 2. 28 week labs done today 3. Vaginal  discharge- Aptima sent, diflucan prescribed 4. Low lying placenta- today's ultrasound puts the edge of the cervix at 2.26 cm from the os. She doesn't need pelvic rest.   Preterm labor symptoms and general obstetric precautions including but not limited to vaginal bleeding, contractions, leaking of fluid and fetal movement were reviewed in detail with the patient. Please refer to After Visit Summary for other counseling recommendations.   Return in about 3 weeks (around 07/09/2023) for ROB visit.  Future Appointments  Date Time Provider Department Center  06/18/2023 10:35 AM Allie Bossier, MD [facility] None    Allie Bossier, MD

## 2023-06-19 LAB — 28 WEEK RH+PANEL
Basophils Absolute: 0 10*3/uL (ref 0.0–0.2)
Basos: 0 %
EOS (ABSOLUTE): 0.4 10*3/uL (ref 0.0–0.4)
Eos: 3 %
Gestational Diabetes Screen: 133 mg/dL (ref 70–139)
HIV Screen 4th Generation wRfx: NONREACTIVE
Hematocrit: 38.4 % (ref 34.0–46.6)
Hemoglobin: 12.5 g/dL (ref 11.1–15.9)
Immature Grans (Abs): 0 10*3/uL (ref 0.0–0.1)
Immature Granulocytes: 0 %
Lymphocytes Absolute: 1.6 10*3/uL (ref 0.7–3.1)
Lymphs: 14 %
MCH: 30.8 pg (ref 26.6–33.0)
MCHC: 32.6 g/dL (ref 31.5–35.7)
MCV: 95 fL (ref 79–97)
Monocytes Absolute: 0.6 10*3/uL (ref 0.1–0.9)
Monocytes: 5 %
Neutrophils Absolute: 9 10*3/uL — ABNORMAL HIGH (ref 1.4–7.0)
Neutrophils: 78 %
Platelets: 259 10*3/uL (ref 150–450)
RBC: 4.06 x10E6/uL (ref 3.77–5.28)
RDW: 12.7 % (ref 11.7–15.4)
RPR Ser Ql: NONREACTIVE
WBC: 11.6 10*3/uL — ABNORMAL HIGH (ref 3.4–10.8)

## 2023-06-19 LAB — CERVICOVAGINAL ANCILLARY ONLY
Bacterial Vaginitis (gardnerella): NEGATIVE
Candida Glabrata: NEGATIVE
Candida Vaginitis: POSITIVE — AB
Chlamydia: NEGATIVE
Comment: NEGATIVE
Comment: NEGATIVE
Comment: NEGATIVE
Comment: NEGATIVE
Comment: NEGATIVE
Comment: NORMAL
Neisseria Gonorrhea: NEGATIVE
Trichomonas: NEGATIVE

## 2023-07-09 ENCOUNTER — Ambulatory Visit: Payer: Medicaid Other | Admitting: Licensed Practical Nurse

## 2023-07-09 ENCOUNTER — Encounter: Payer: Self-pay | Admitting: Licensed Practical Nurse

## 2023-07-09 VITALS — BP 98/57 | HR 95 | Wt 171.5 lb

## 2023-07-09 DIAGNOSIS — Z3A29 29 weeks gestation of pregnancy: Secondary | ICD-10-CM

## 2023-07-09 DIAGNOSIS — O0993 Supervision of high risk pregnancy, unspecified, third trimester: Secondary | ICD-10-CM

## 2023-07-09 DIAGNOSIS — O09892 Supervision of other high risk pregnancies, second trimester: Secondary | ICD-10-CM

## 2023-07-09 DIAGNOSIS — Z23 Encounter for immunization: Secondary | ICD-10-CM | POA: Diagnosis not present

## 2023-07-09 LAB — POCT URINALYSIS DIPSTICK
Bilirubin, UA: NEGATIVE
Blood, UA: NEGATIVE
Glucose, UA: NEGATIVE
Ketones, UA: NEGATIVE
Leukocytes, UA: NEGATIVE
Nitrite, UA: NEGATIVE
Protein, UA: NEGATIVE
Spec Grav, UA: 1.015 (ref 1.010–1.025)
Urobilinogen, UA: 0.2 U/dL
pH, UA: 7.5 (ref 5.0–8.0)

## 2023-07-09 NOTE — Progress Notes (Signed)
Routine Prenatal Care Visit  Subjective  Sarah Kennedy is a 21 y.o. [phone] at [redacted]w[redacted]d being seen today for ongoing prenatal care.  She is currently monitored for the following issues for this high-risk pregnancy and has Seizure disorder in pregnancy, antepartum, second trimester (HCC); Uterine fibroid in pregnancy; Marijuana use during pregnancy; LGSIL on Pap smear of cervix; Low lying placenta nos or without hemorrhage, second trimester; Supervision of other high risk pregnancies, second trimester; and Obesity in pregnancy on their problem list.  ----------------------------------------------------------------------------------- Patient reports  starting to feel over all uncomfortable. Rec support belt, swimming and yoga . -Here with Ihor Gully and her younger son -Took Diflucan, denies any sxs -expressed concern that she is smaller compared to her previous pregnancies, FH WNL reassured she is "normal"    Contractions: Irritability. Vag. Bleeding: None.  Movement: Present. Leaking Fluid denies.  ----------------------------------------------------------------------------------- The following portions of the patient's history were reviewed and updated as appropriate: allergies, current medications, past family history, past medical history, past social history, past surgical history and problem list. Problem list updated.  Objective  Blood pressure (!) 98/57, pulse 95, weight 171 lb 8 oz (77.8 kg), last menstrual period 12/16/2022, unknown if currently breastfeeding. Pregravid weight Pregravid weight not on file Total Weight Gain Not found. Urinalysis: Urine Protein    Urine Glucose    Fetal Status: Fetal Heart Rate (bpm): 125 Fundal Height: 30 cm Movement: Present     General:  Alert, oriented and cooperative. Patient is in no acute distress.  Skin: Skin is warm and dry. No rash noted.   Cardiovascular: Normal heart rate noted  Respiratory: Normal respiratory effort, no problems with  respiration noted  Abdomen: Soft, gravid, appropriate for gestational age. Pain/Pressure: Present     Pelvic:  Cervical exam deferred        Extremities: Normal range of motion.  Edema: None  Mental Status: Normal mood and affect. Normal behavior. Normal judgment and thought content.   Assessment   22 y.o. [id number] at [redacted]w[redacted]d by  09/22/2023, by Last Menstrual Period presenting for routine prenatal visit  Plan   PREGNANCY Problems (from 07/09/23 to present)     No problems associated with this episode.        Preterm labor symptoms and general obstetric precautions including but not limited to vaginal bleeding, contractions, leaking of fluid and fetal movement were reviewed in detail with the patient. Please refer to After Visit Summary for other counseling recommendations.   Return in about 2 weeks (around 07/23/2023) for ROB. TDAP given today   Carie Caddy, CNM  [facility]  07/09/23  11:48 AM

## 2023-07-23 ENCOUNTER — Encounter: Payer: Self-pay | Admitting: Certified Nurse Midwife

## 2023-07-23 ENCOUNTER — Ambulatory Visit (INDEPENDENT_AMBULATORY_CARE_PROVIDER_SITE_OTHER): Payer: Medicaid Other | Admitting: Certified Nurse Midwife

## 2023-07-23 VITALS — BP 94/63 | HR 86 | Wt 174.2 lb

## 2023-07-23 DIAGNOSIS — Z3A31 31 weeks gestation of pregnancy: Secondary | ICD-10-CM

## 2023-07-23 NOTE — Progress Notes (Signed)
ROB doing well, feeling good movement. Reviewed braxton hicks vs PTL . Discussed self help measures for round ligament pain . Pt ask about additional u/s. Reviewed no needed at this time she had 3rd trimester u/s @ 28 wks for growth 9/10. No indication for u/s at this time. She verbalizes understanding. Follow up 2 wks.   Doreene Burke, CNM

## 2023-07-23 NOTE — Patient Instructions (Signed)
Preterm Labor Pregnancy normally lasts 39-41 weeks. Preterm labor is when labor starts before you have been pregnant for 37 weeks. Babies who are born too early may have a higher risk for long-term problems like cerebral palsy or developmental delays. They may also have problems soon after birth, such as problems with blood sugar, body temperature, heart, and breathing. These problems may be very serious in babies who are born before 34 weeks of pregnancy. What are the causes? The cause of this condition is not known. What increases the risk? You are more likely to have preterm labor if: You have medical problems, now or in the past. You have problems now or in your past pregnancies. You have lifestyle problems. Medical history You have problems of the womb (uterus). You have an infection, including infections you get from sex. You have problems that do not go away, such as: Blood clots. High blood pressure. High blood sugar. You have low body weight or too much body weight. Present and past pregnancies You have had preterm labor before. You are pregnant with two babies or more. You have a condition in which the placenta covers your cervix. You waited less than 18 months between giving birth and becoming pregnant again. Your unborn baby has some problems. You have bleeding from your vagina. You became pregnant by a method called IVF. Lifestyle You smoke. You drink alcohol. You use drugs. You have stress. You have abuse in your home. You come in contact with chemicals that harm the body (pollutants). Other factors You are younger than 17 years or older than 35 years. What are the signs or symptoms? Symptoms of this condition include: Cramps. The cramps may feel like cramps from a period. You may also have watery poop (diarrhea). Pain in the belly (abdomen). Pain in the lower back. Regular contractions. It may feel like your belly is getting tighter. Pressure in the lower  belly. More fluid leaking from the vagina. The fluid may be watery or bloody. Water breaking. How is this treated? Treatment for this condition depends on your health, the health of your baby, and how old your pregnancy is. It may include: Taking medicines, such as: Hormone medicines. Medicines to stop contractions. Medicines to help mature the baby's lungs. Medicines to prevent your baby from getting cerebral palsy or other problems. Bed rest. If the labor happens before 34 weeks of pregnancy, you may need to stay in the hospital. Delivering the baby. Follow these instructions at home:  Do not smoke or use any products that contain nicotine or tobacco. If you need help quitting, ask your doctor. Do not drink alcohol. Take over-the-counter and prescription medicines only as told by your doctor. Rest as told by your doctor. Return to your normal activities when your doctor says that it is safe. Keep all follow-up visits. How is this prevented? To have a healthy pregnancy: Do not use drugs. Do not use any medicines unless you ask your doctor if they are safe for you. Talk with your doctor before taking any herbal supplements. Make sure you gain enough weight. Watch for infection. If you think you might have an infection, get it checked right away. Symptoms of infection may include: Fever. Vaginal discharge that smells bad or is not normal. Pain or burning when you pee. Needing to pee urgently. Needing to pee often. Peeing small amounts often. Blood in your pee. Pee that smells bad or unusual. Where to find more information U.S. Department of Health and [employer]  Office on [employer] Health: http://hoffman.com/ [employer] of Obstetricians and Gynecologists: www.acog.org Centers for Disease Control and Prevention: FootballExhibition.com.br Contact a doctor if: You think you are going into preterm labor. You have symptoms of preterm labor. You have symptoms of infection. Get help  right away if: You are having painful contractions every 5 minutes or less. Your water breaks. Summary Preterm labor is labor that starts before you reach 37 weeks of pregnancy. Your baby may have problems if delivered early. You are more likely to have preterm labor if you have certain medical problems or problems with a pregnancy now or in the past. Some lifestyle factors can also increase the risk. Contact a doctor if you have symptoms of preterm labor. This information is not intended to replace advice given to you by your health care provider. Make sure you discuss any questions you have with your health care provider. Document Revised: 09/23/2020 Document Reviewed: 09/27/2020 Elsevier Patient Education  2024 [employer].

## 2023-08-06 ENCOUNTER — Telehealth: Payer: Self-pay | Admitting: Licensed Practical Nurse

## 2023-08-06 ENCOUNTER — Encounter: Payer: Medicaid Other | Admitting: Licensed Practical Nurse

## 2023-08-06 NOTE — Telephone Encounter (Signed)
Reached out to pt to reschedule ROB appt that was scheduled on 08/06/23 at 3:55 with LMD.  Was able to reschedule appt to 08/08/2023 at 3:55 with Robb Matar.

## 2023-08-08 ENCOUNTER — Ambulatory Visit: Payer: Medicaid Other | Admitting: Certified Nurse Midwife

## 2023-08-08 ENCOUNTER — Encounter: Payer: Self-pay | Admitting: Certified Nurse Midwife

## 2023-08-08 VITALS — BP 106/67 | HR 96 | Wt 174.1 lb

## 2023-08-08 DIAGNOSIS — O0993 Supervision of high risk pregnancy, unspecified, third trimester: Secondary | ICD-10-CM

## 2023-08-08 DIAGNOSIS — Z3A33 33 weeks gestation of pregnancy: Secondary | ICD-10-CM

## 2023-08-08 LAB — POCT URINALYSIS DIPSTICK
Bilirubin, UA: NEGATIVE
Blood, UA: NEGATIVE
Glucose, UA: NEGATIVE
Ketones, UA: NEGATIVE
Leukocytes, UA: NEGATIVE
Nitrite, UA: NEGATIVE
Protein, UA: NEGATIVE
Spec Grav, UA: 1.015 (ref 1.010–1.025)
Urobilinogen, UA: 0.2 U/dL
pH, UA: 7.5 (ref 5.0–8.0)

## 2023-08-08 NOTE — Progress Notes (Signed)
    Return Prenatal Note   Subjective   22 y.o. [id number] at [redacted]w[redacted]d presents for this follow-up prenatal visit.  Patient feeling overall well, reports tired but this is usual. She had a seizure on 10/24, first since initial seizure at the end of June. She was seen in [facility] ED. Her next neuro appointment is 11/7 and she is scheduled for an EEG. Patient reports: Movement: Present Contractions: Irritability  Objective   Flow sheet Vitals: Pulse Rate: 96 BP: 106/67 Fundal Height: 34 cm Fetal Heart Rate (bpm): 140 Presentation: Vertex Total weight gain: 8 lb 1.6 oz (3.674 kg)  General Appearance  No acute distress, well appearing, and well nourished Pulmonary   Normal work of breathing Neurologic   Alert and oriented to person, place, and time Psychiatric   Mood and affect within normal limits  Assessment/Plan   Plan  22 y.o. [id number] at [redacted]w[redacted]d presents for follow-up OB visit. Reviewed prenatal record including previous visit note.  1. Supervision of high risk pregnancy in third trimester - POCT Urinalysis Dipstick - US OB Comp + 14 Wk; Future  2. [redacted] weeks gestation of pregnancy - POCT Urinalysis Dipstick - US OB Comp + 14 Wk; Future  -Discussed recent seizure activity with Dr. Valentino Saxon, plan for repeat ultrasound for growth -Maintain next visit with neuro -36w labs next visit.    Orders Placed This Encounter  Procedures   US OB Comp + 14 Wk    Standing Status:   Future    Standing Expiration Date:   11/08/2023    Order Specific Question:   Reason for Exam (SYMPTOM  OR DIAGNOSIS REQUIRED)    Answer:   seizure disorder in pregnancy on Keppra, growth ultrasound, [facility] 09/22/23    Order Specific Question:   Preferred Imaging Location?    Answer:   [facility] @ [facility]   POCT Urinalysis Dipstick   No follow-ups on file.   Future Appointments  Date Time Provider Department Center  08/20/2023  3:35 PM Doreene Burke, CNM [facility] None    For next visit:   continue with routine prenatal care     Dominica Severin, CNM  10/31/244:58 PM

## 2023-08-16 ENCOUNTER — Ambulatory Visit
Admission: RE | Admit: 2023-08-16 | Discharge: 2023-08-16 | Disposition: A | Discharge status: Home / Self Care | Payer: Medicaid Other | Source: Ambulatory Visit | Attending: Certified Nurse Midwife | Admitting: Certified Nurse Midwife

## 2023-08-16 DIAGNOSIS — Z3A33 33 weeks gestation of pregnancy: Secondary | ICD-10-CM | POA: Diagnosis present

## 2023-08-16 DIAGNOSIS — O0993 Supervision of high risk pregnancy, unspecified, third trimester: Secondary | ICD-10-CM | POA: Diagnosis present

## 2023-08-20 ENCOUNTER — Ambulatory Visit (INDEPENDENT_AMBULATORY_CARE_PROVIDER_SITE_OTHER): Payer: Medicaid Other | Admitting: Certified Nurse Midwife

## 2023-08-20 ENCOUNTER — Encounter: Payer: Self-pay | Admitting: Certified Nurse Midwife

## 2023-08-20 VITALS — BP 102/66 | HR 73 | Wt 174.2 lb

## 2023-08-20 DIAGNOSIS — Z3A35 35 weeks gestation of pregnancy: Secondary | ICD-10-CM

## 2023-08-20 NOTE — Patient Instructions (Signed)

## 2023-08-20 NOTE — Progress Notes (Signed)
ROB doing well, feeling good movement. No concerns today. Discussed GBS testing next visit. She verbalize understanding. Follow up 1 wk.   Doreene Burke, CNM

## 2023-08-27 ENCOUNTER — Encounter: Payer: Medicaid Other | Admitting: Licensed Practical Nurse

## 2023-08-28 ENCOUNTER — Ambulatory Visit (INDEPENDENT_AMBULATORY_CARE_PROVIDER_SITE_OTHER): Payer: Medicaid Other | Admitting: Licensed Practical Nurse

## 2023-08-28 ENCOUNTER — Other Ambulatory Visit (HOSPITAL_COMMUNITY)
Admission: RE | Admit: 2023-08-28 | Discharge: 2023-08-28 | Disposition: A | Discharge status: Home / Self Care | Payer: Medicaid Other | Source: Ambulatory Visit | Attending: Licensed Practical Nurse | Admitting: Licensed Practical Nurse

## 2023-08-28 ENCOUNTER — Encounter: Payer: Self-pay | Admitting: Licensed Practical Nurse

## 2023-08-28 VITALS — BP 110/59 | HR 88 | Wt 171.3 lb

## 2023-08-28 DIAGNOSIS — Z3A36 36 weeks gestation of pregnancy: Secondary | ICD-10-CM | POA: Diagnosis present

## 2023-08-28 DIAGNOSIS — O0993 Supervision of high risk pregnancy, unspecified, third trimester: Secondary | ICD-10-CM

## 2023-08-28 NOTE — Progress Notes (Signed)
Routine Prenatal Care Visit  Subjective  Sarah Kennedy is a 22 y.o. [id number] at [redacted]w[redacted]d being seen today for ongoing prenatal care.  She is currently monitored for the following issues for this high-risk pregnancy and has Seizure disorder in pregnancy, antepartum, second trimester (HCC); Uterine fibroid in pregnancy; Marijuana use during pregnancy; LGSIL on Pap smear of cervix; Low lying placenta nos or without hemorrhage, second trimester; Supervision of other high risk pregnancies, second trimester; and Obesity in pregnancy on their problem list.  ----------------------------------------------------------------------------------- Patient reports {sx:14538}.   Contractions: Irritability. Vag. Bleeding: None.  Movement: Present. Leaking Fluid {Actions; denies/reports/admits to:19208}.  ----------------------------------------------------------------------------------- The following portions of the patient's history were reviewed and updated as appropriate: allergies, current medications, past family history, past medical history, past social history, past surgical history and problem list. Problem list updated.  Objective  Blood pressure (!) 110/59, pulse 88, weight 171 lb 4.8 oz (77.7 kg), last menstrual period 12/16/2022, unknown if currently breastfeeding. Pregravid weight 166 lb (75.3 kg) Total Weight Gain 5 lb 4.8 oz (2.404 kg) Urinalysis: Urine Protein    Urine Glucose    Fetal Status: Fetal Heart Rate (bpm): 140 Fundal Height: 37 cm Movement: Present  Presentation: Vertex  General:  Alert, oriented and cooperative. Patient is in no acute distress.  Skin: Skin is warm and dry. No rash noted.   Cardiovascular: Normal heart rate noted  Respiratory: Normal respiratory effort, no problems with respiration noted  Abdomen: Soft, gravid, appropriate for gestational age. Pain/Pressure: Present     Pelvic:  {Blank single:19197::"Cervical exam performed","Cervical exam deferred"} Dilation: 1  Effacement (%): 50 Station: -3  Extremities: Normal range of motion.  Edema: None  Mental Status: Normal mood and affect. Normal behavior. Normal judgment and thought content.   Assessment   22 y.o. [id number] at [redacted]w[redacted]d by  09/22/2023, by Last Menstrual Period presenting for {Blank single:19197::"routine","work-in"} prenatal visit  Plan   PREGNANCY Problems (from 07/09/23 to present)     Problem Noted Resolved   Supervision of other high risk pregnancies, second trimester 05/21/2023 by Allie Bossier, MD No   Overview Addendum 07/09/2023  1:52 PM by Ellwood Sayers, CNM     Nursing Staff Provider  Office Location  [facility] Dating  LMP  Language  English Anatomy US  Normal, placenta 2.2 cm from os  Flu Vaccine   Genetic Screen  NIPS: neg, female  TDaP vaccine   07/09/23 Hgb A1C or  GTT Early : Third trimester : 133  Covid    LAB RESULTS   Rhogam  NA Blood Type O/Positive/-- (06/04 1518)   Feeding Plan  Antibody Negative (06/04 1518)  Contraception  Rubella 1.03 (06/04 1518)  Circumcision  RPR Non Reactive (09/10 1014)   Pediatrician   HBsAg Negative (06/04 1518)   Support Person Ihor Gully HIV Non Reactive (09/10 1014)  Prenatal Classes  Varicella Immune    GBS  (For PCN allergy, check sensitivities)   BTL Consent     VBAC Consent  Pap  LSIL HPV neg, 03/2023     Hgb Electro    Pelvis Tested  CF      SMA                    Preterm labor symptoms and general obstetric precautions including but not limited to vaginal bleeding, contractions, leaking of fluid and fetal movement were reviewed in detail with the patient. Please refer to After Visit Summary for other counseling recommendations.   Return in  about 1 week (around 09/04/2023) for ROB.  @MYSIGNATURE @

## 2023-08-30 LAB — CERVICOVAGINAL ANCILLARY ONLY
Bacterial Vaginitis (gardnerella): NEGATIVE
Candida Glabrata: NEGATIVE
Candida Vaginitis: POSITIVE — AB
Chlamydia: NEGATIVE
Comment: NEGATIVE
Comment: NEGATIVE
Comment: NEGATIVE
Comment: NEGATIVE
Comment: NEGATIVE
Comment: NORMAL
Neisseria Gonorrhea: NEGATIVE
Trichomonas: NEGATIVE

## 2023-08-30 LAB — STREP GP B NAA: Strep Gp B NAA: POSITIVE — AB

## 2023-09-01 ENCOUNTER — Telehealth: Payer: Self-pay | Admitting: Licensed Practical Nurse

## 2023-09-01 NOTE — Telephone Encounter (Signed)
TC to [facility], Your swab shows you have yeast, which we discussed at your visit. Emerlyn has not yet picked up Momistat. Alonna Minium mentioned she has had back pain and pelvic pressure, she was contracting last night-but not right now. Comfort measures reviewed. Iolene will go to the pharmacy to pick up Monistat 7 cream Carie Caddy, CNM  [facility]  09/01/23 11:28 AM

## 2023-09-04 ENCOUNTER — Encounter: Payer: Self-pay | Admitting: Obstetrics and Gynecology

## 2023-09-04 ENCOUNTER — Encounter: Payer: Self-pay | Admitting: Licensed Practical Nurse

## 2023-09-04 ENCOUNTER — Ambulatory Visit (INDEPENDENT_AMBULATORY_CARE_PROVIDER_SITE_OTHER): Payer: Medicaid Other | Admitting: Obstetrics and Gynecology

## 2023-09-04 VITALS — BP 97/62 | HR 93 | Wt 170.0 lb

## 2023-09-04 DIAGNOSIS — Z3A37 37 weeks gestation of pregnancy: Secondary | ICD-10-CM

## 2023-09-04 DIAGNOSIS — O99352 Diseases of the nervous system complicating pregnancy, second trimester: Secondary | ICD-10-CM

## 2023-09-04 DIAGNOSIS — O0993 Supervision of high risk pregnancy, unspecified, third trimester: Secondary | ICD-10-CM

## 2023-09-04 DIAGNOSIS — G40909 Epilepsy, unspecified, not intractable, without status epilepticus: Secondary | ICD-10-CM

## 2023-09-04 DIAGNOSIS — O99820 Streptococcus B carrier state complicating pregnancy: Secondary | ICD-10-CM | POA: Insufficient documentation

## 2023-09-04 NOTE — Progress Notes (Signed)
ROB [redacted]w[redacted]d: She is doing well. She has good fetal movement and no new concerns.

## 2023-09-04 NOTE — Patient Instructions (Signed)
Natural methods to encourage timely labor   Your EDD (estimated due date) is just that... an estimate.  Your baby may come earlier or later, there are a lot of hormones and feedback loops to cause labor to occur and to be honest, no one actually knows the EXACT reason why this occurs.     Full term is birth after 37 weeks, while post-term is birth after 41 weeks.  We will guide you in this process of determining labor and delivery planning, but we hope that you will go into spontaneous labor between these 4 weeks.  Review the information in your Midwifery handout for our induction of labor detail, statistics, and processes as when your pregnancy ages, so does your placenta and this can lead to poorer placental.    The best natural method to consider when encouraging labor is time and patience.  This information below is to discuss ways that your body can prepare and be ready for labor and encourage this to happen on its own without a medical induction at the hospital.  Many methods do not have a ton of evidence based research behind them, but do come with minimal risk and potential good benefits  of helping your body prepare for labor. The information on herbs has been gathered over hundreds of years and has little scientific research, but has been tested by time and experience.       At 36 weeks: EVENING PRIMROSE OIL: -- These are available in capsules (500-1000mg ) at whole foods, [employer], [place], etc.  -- May take 1-3 capsules a day.  The capsules can be taken orally and swallowed, or inserted vaginally at night.  They will leak out overnight, so wear a pad or a liner to avoid oil on your sheets.  -- May take one capsule orally and insert one vaginally at night until delivery.  May increase to 2 vaginally at night from 38 weeks to delivery.  -- These contain GLA (an Omega-6 fatty acid) and are a precursor to labor hormones and have been shown to get the body ready for labor.  They may soften  the cervix, but will not cause you to be induced, so take without worry.   -- If you have had a vaginal delivery in the past, likely your cervix is already ripe and you do not need to take these. Discuss with your midwife.    DATES: -- Eat 6 dates a day from 36 weeks until delivery. If you have gestational diabetes, please discuss this with [place], as you should probably not use this with the high sugar content in dates.   -- One study showed a reduced need for inducing and augmenting labor and a more favorable delivery outcome, but this was not statistically significant in the study.    RED RASPBERRY LEAF TEA -- Drink 1-2 cups a day until labor begins to help tone the uterus and organize contractions -- Make a strong tea using 1/4 oz dried herb to 1 pint of water and steep for 20 minutes (or use some that is commercially available).  -- Try to do this early in the day as contractions overnight can disrupt a good nights sleep!   ACUPUNCTURE -- can be done weekly starting at 36 weeks by a trained acupuncturist.  There are many in the area that perform this using fine needles at certain points on the body to help ripen the cervix and induce labor.  -- if this is performed close to the  due date, it has been shown to have a success rate of up to 88% in starting labor.    At 37 weeks:  EVENING PRIMROSE OIL --Take 1000 mg  twice daily - by mouth in the morning and vaginally at bedtime. (If previous C-section, take both doses by mouth)   At 38-39 weeks:  BLUE COHOSH 1 capsule daily starting at [redacted] weeks gestation 2 capsules daily starting at [redacted] weeks gestation 3 capsules daily starting at [redacted] weeks gestation   OTHER SUGGESTIONS: SPINNING BABIES and the MILES CIRCUIT (to encourage optimal positioning)  www.spinningbabies.com & https://glass.com/.com    BELLY BINDING --Poor abdominal support can lead to malpositioned babies and therefore prodromal labor.  Support good abdominal tone and relieve back  pain by wearing a tummy support whether with a belly binder, using a sheet to wrap around your hips, or a rebozo.  It will lift up your baby and place them more centrally in your pelvis to encourage pressure on your cervix.     EXERCISE -- Walking, belly dancing, swimming, bouncing on the birthing ball and some yoga movements can help the baby to descend into the birth canal and apply pressure on the cervix. Talk to [place] about which position may be most effective for you and your babys current position.     FOODS -- pineapple: rich in bromelain, an enzyme that can encourage the ripening of the cervix and simulate prostaglandin production             * eat this fresh daily, as canning and juicing destroys the bromelain -- black licorice candy (real): also known to stimulate prostaglandin production due to glycyrrhizin but can also cause mild diarrhea that may lead to sympthatetic uterine contractions, not much research here on this.    HERBS -- Basil, oregano, and thyme can help to coordinate your contractions if they are irregular and help to soften your cervix.  Make an eggplant parmesan or similar that uses these herbs or make a tea with one teaspoon of each and steep for 5 minutes.  Strain the leaves, add honey to sweeten it and sip the tea to welcome labor.    MASSAGE -- Prenatal massage increases blood circulation which can increase the circulation to the placenta and therefore the baby.  It also stimulates the lymph system to increase immunity and release toxins.  Massage can also increase endorphins into the mothers body to prepare her for an easier delivery with the sedating effects on the nervous system and promoting stress relief and relaxation.    RELAXATION/VISUALIZATION --The psyche is one of the 5 P's of labor (others are the passenger, passage, powers, and placenta). Fear and anxiety can stimmulate catecholamine release which can counteract the flow of hormones necessary in promoting  labor.  -- Choose your birth team and environment carefully so that you can feel relaxed and comfortable in the place of labor and birth.  Everyone should believe in you and your power to birth your baby.   -- Some choose to bring lights, battery candles, birth cards (that have photos or phrases of encouragement), essential oil diffusers, etc. to the labor room to help them stay relaxed in a new environment.  c

## 2023-09-04 NOTE — Progress Notes (Signed)
ROB: Patient is a 22 y.o. [id number] at [redacted]w[redacted]d who presents for routine OB care.  Pregnancy is complicated by: Seizure disorder in pregnancy, antepartum, second trimester (HCC); Uterine fibroid in pregnancy; Marijuana use during pregnancy; LGSIL on Pap smear of cervix; Low lying placenta nos or without hemorrhage, second trimester; Supervision of other high risk pregnancies, second trimester; and Obesity in pregnancy.   Patient has complaints of pelvic pressure and pain. Notes intermittent contractions that wake her up at night but usually resolve. Discussed natural cervical ripening methods, signs/symptoms of labor. Reviewed 36 wee labs, patient is GBS+, will need Abx in labor. RTC in 1 week.

## 2023-09-11 ENCOUNTER — Ambulatory Visit (INDEPENDENT_AMBULATORY_CARE_PROVIDER_SITE_OTHER): Payer: Medicaid Other

## 2023-09-11 VITALS — BP 111/73 | HR 97 | Wt 176.0 lb

## 2023-09-11 DIAGNOSIS — Z3A38 38 weeks gestation of pregnancy: Secondary | ICD-10-CM | POA: Diagnosis not present

## 2023-09-11 DIAGNOSIS — O09893 Supervision of other high risk pregnancies, third trimester: Secondary | ICD-10-CM

## 2023-09-11 DIAGNOSIS — O09892 Supervision of other high risk pregnancies, second trimester: Secondary | ICD-10-CM

## 2023-09-11 NOTE — Progress Notes (Signed)
    Return Prenatal Note   Assessment/Plan   Plan  22 y.o. [id number] at [redacted]w[redacted]d presents for follow-up OB visit. Reviewed prenatal record including previous visit note.  Supervision of other high risk pregnancies, second trimester - Cervical exam today per patient request, 2-3/50/-3. - Plans to deliver at [facility] due to bad experience at [facility].  - Reviewed labor warning signs and expectations for birth. Instructed to call office or come to hospital with persistent headache, vision changes, regular contractions, leaking of fluid, decreased fetal movement or vaginal bleeding.    No orders of the defined types were placed in this encounter.  Return in about 1 week (around 09/18/2023) for ROB.   Future Appointments  Date Time Provider Department Center  09/18/2023  3:55 PM Hildred Laser, MD [facility] None    For next visit:  continue with routine prenatal care     Subjective   22 y.o. [id number] at 100w3d presents for this follow-up prenatal visit.  Patient reports loss of mucous plug and continued mucous discharge.  Patient reports: Movement: Present Contractions: Irritability  Objective   Flow sheet Vitals: Pulse Rate: 97 BP: 111/73 Fundal Height: 38 cm Fetal Heart Rate (bpm): 120 Presentation: Vertex Dilation: 2.5 Effacement (%): 50 Station: -3 Total weight gain: 10 lb (4.536 kg)  General Appearance  No acute distress, well appearing, and well nourished Pulmonary   Normal work of breathing Neurologic   Alert and oriented to person, place, and time Psychiatric   Mood and affect within normal limits  Lindalou Hose Afra Tricarico, CNM  12/04/244:40 PM

## 2023-09-11 NOTE — Assessment & Plan Note (Signed)
-   Cervical exam today per patient request, 2-3/50/-3. - Plans to deliver at [facility] due to bad experience at [facility].  - Reviewed labor warning signs and expectations for birth. Instructed to call office or come to hospital with persistent headache, vision changes, regular contractions, leaking of fluid, decreased fetal movement or vaginal bleeding.

## 2023-09-18 ENCOUNTER — Encounter: Payer: Medicaid Other | Admitting: Obstetrics and Gynecology

## 2023-09-18 DIAGNOSIS — O09892 Supervision of other high risk pregnancies, second trimester: Secondary | ICD-10-CM

## 2023-09-30 ENCOUNTER — Telehealth (INDEPENDENT_AMBULATORY_CARE_PROVIDER_SITE_OTHER): Payer: Medicaid Other | Admitting: Obstetrics

## 2023-09-30 NOTE — Progress Notes (Signed)
   Virtual Visit via Video Note   I connected with Sarah Kennedy on 09/30/23 8:26 AM by a video enabled telemedicine application and verified that I am speaking with the correct person using two identifiers.   Location: Patient: home Provider: [facility] clinic   I discussed the limitations of evaluation and management by telemedicine and the availability of in person appointments. The patient expressed understanding and agreed to proceed. Subjective:    Subjective  Sarah Kennedy is a 22 y.o. female who presents for a postpartum visit. She is2 weeks  postpartum following a spontaneous vaginal delivery. I have fully reviewed the prenatal and intrapartum course. The delivery was at [redacted]w[redacted]d .  Anesthesia: epidural.    Postpartum course has been normal. She has no pain and no other concerns. Baby's course has been normal. Baby is feeding by   bottle . Bleeding  is light . Bowel function is normal. Bladder function is normal.  Her breasts are still leaking milk despite wearing a tight bra, using cabbage leaves, and avoiding warm showers. Contraception method:  plans Depo . Postpartum depression screening:      Review of Systems Pertinent positives noted in HPI. Remainder of comprehensive ROS otherwise negative.     Objective:      Objective [] Expand by Default There were no vitals taken for this visit.  General:  alert, cooperative, and no distress        Assessment:    Normal postpartum course, mood stable.   Plan:      Plan -Anticipatory guidance about the postpartum period -Reviewed s/s of PPD and PPA -Recommend mint or sage tea, tight-fitting bra, Sudafed for decreasing milk production -Discussed when to seek medical care -Schedule Depo shot  -Office visit at 6 weeks postpartum  I discussed the assessment and treatment plan with the patient. The patient was provided an opportunity to ask questions and all were answered. The patient agreed with the plan and demonstrated an  understanding of the instructions.   The patient was advised to call back or seek an in-person evaluation if the symptoms worsen or if the condition fails to improve as anticipated.   I provided 7 minutes of non-face-to-face time during this encounter.     Glenetta Borg, CNM

## 2023-10-11 ENCOUNTER — Ambulatory Visit: Payer: Medicaid Other

## 2023-10-11 NOTE — Progress Notes (Deleted)
    NURSE VISIT NOTE  Subjective:    Patient ID: Sarah Kennedy, female    DOB: 2001/08/29, 23 y.o.   MRN: [id number]  HPI  Patient is a 23 y.o. G57P2012 female who presents for depo provera  injection.   Objective:    LMP 12/16/2022 (Approximate)   Last Annual: N/A. Last pap: 03/26/2023. Last Depo-Provera : N/A. Side Effects if any: none. Serum HCG indicated? No . Depo-Provera  150 mg IM given by: Rollo Maxin, CMA. Site: {[facility] INJ [id number]  Lab Review  @THIS  VISIT ONLY@  Assessment:   No diagnosis found.   Plan:   Next appointment due between 12/27/23 and 01/10/24.    Rollo JINNY Maxin, CMA

## 2023-10-28 ENCOUNTER — Encounter: Payer: Self-pay | Admitting: Obstetrics

## 2023-10-28 ENCOUNTER — Ambulatory Visit: Payer: Medicaid Other | Admitting: Obstetrics

## 2023-10-28 VITALS — BP 109/71 | HR 70 | Wt 173.0 lb

## 2023-10-28 DIAGNOSIS — Z3202 Encounter for pregnancy test, result negative: Secondary | ICD-10-CM | POA: Diagnosis not present

## 2023-10-28 DIAGNOSIS — Z3042 Encounter for surveillance of injectable contraceptive: Secondary | ICD-10-CM

## 2023-10-28 LAB — POCT URINE PREGNANCY: Preg Test, Ur: NEGATIVE

## 2023-10-28 MED ORDER — MEDROXYPROGESTERONE ACETATE 150 MG/ML IM SUSY
150.0000 mg | PREFILLED_SYRINGE | Freq: Once | INTRAMUSCULAR | Status: AC
  Administered 2023-10-28: 150 mg via INTRAMUSCULAR

## 2023-10-28 NOTE — Progress Notes (Signed)
Post Partum Visit Note  Sarah Kennedy is a 23 y.o. [phone] female who presents for a postpartum visit. She is 6 weeks postpartum following a normal spontaneous vaginal delivery.  I have fully reviewed the prenatal and intrapartum course. The delivery was at 39.1 gestational weeks.  Anesthesia: epidural. Postpartum course has been uncomplicated. Baby is doing well. Baby is feeding by  bottle . Bleeding  stopped at about 2-3 weeks PP, menses now resumed . Bowel function is normal. Bladder function is normal. Patient is sexually active. Contraception method is Depo-Provera injections. Postpartum depression screening: negative.   The pregnancy intention screening data noted above was reviewed. Potential methods of contraception were discussed. The patient elected to proceed with No data recorded.   Edinburgh Postnatal Depression Scale - 10/28/23 1049       Edinburgh Postnatal Depression Scale:  In the Past 7 Days   I have been able to laugh and see the funny side of things. 0    I have looked forward with enjoyment to things. 0    I have blamed myself unnecessarily when things went wrong. 0    I have been anxious or worried for no good reason. 0    I have felt scared or panicky for no good reason. 0    Things have been getting on top of me. 0    I have been so unhappy that I have had difficulty sleeping. 0    I have felt sad or miserable. 0    I have been so unhappy that I have been crying. 0    The thought of harming myself has occurred to me. 0    Edinburgh Postnatal Depression Scale Total 0             Health Maintenance Due  Topic Date Due   HPV VACCINES (1 - 3-dose series) Never done   INFLUENZA VACCINE  05/09/2023   COVID-19 Vaccine (1 - 2024-25 season) Never done    The following portions of the patient's history were reviewed and updated as appropriate: allergies, current medications, past medical history, and problem list.  Review of Systems Pertinent items are  noted in HPI.  Objective:  BP 109/71   Pulse 70   Wt 173 lb (78.5 kg)   LMP 10/28/2023 (Approximate)   Breastfeeding No   BMI 31.64 kg/m    General:  alert and cooperative   Breasts:  normal  Lungs: clear to auscultation bilaterally  Heart:  regular rate and rhythm, S1, S2 normal, no murmur, click, rub or gallop  Abdomen: soft, non-tender; bowel sounds normal; no masses,  no organomegaly   Wound: N/A  GU exam:   Not indicated       Assessment:    1. Encounter for Depo-Provera contraception    2. Normal postpartum exam.   Plan:   Essential components of care per ACOG recommendations:  1.  Mood and well being: Patient with negative depression screening today. Reviewed local resources for support.  - Patient tobacco use? No.   - hx of drug use? No.    2. Infant care and feeding:  -Patient currently breastmilk feeding? No.  -Social determinants of health (SDOH) reviewed in EPIC. No concerns  3. Sexuality, contraception and birth spacing - Patient does not want a pregnancy in the next year.  Desired family size is 3-4 children.  - Reviewed reproductive life planning. Reviewed contraceptive methods based on pt preferences and effectiveness.  Patient desired Hormonal Injection  today.   - Discussed birth spacing of 18 months  4. Sleep and fatigue -Encouraged family/partner/community support of 4 hrs of uninterrupted sleep to help with mood and fatigue  5. Physical Recovery  - Discussed patients delivery and complications. She describes her labor as good. - Patient had a  NSVD . Patient had an intact perineum. Perineal healing reviewed. Patient expressed understanding - Patient has urinary incontinence? No. - Patient is safe to resume physical and sexual activity  6.  Health Maintenance  - Last pap smear  Diagnosis  Date Value Ref Range Status  03/26/2023 - Low grade squamous intraepithelial lesion (LSIL) (A)  Final   Pap smear not done at today's visit. Schedule for  03/2024. -Breast Cancer screening indicated? No.   7. Chronic Disease/Pregnancy Condition follow up: None  - PCP follow up  Glenetta Borg, CNM [facility]

## 2024-01-20 ENCOUNTER — Ambulatory Visit: Payer: Medicaid Other

## 2024-02-25 ENCOUNTER — Ambulatory Visit (INDEPENDENT_AMBULATORY_CARE_PROVIDER_SITE_OTHER)

## 2024-02-25 VITALS — BP 102/69 | HR 80 | Wt 192.8 lb

## 2024-02-25 DIAGNOSIS — Z3202 Encounter for pregnancy test, result negative: Secondary | ICD-10-CM | POA: Diagnosis not present

## 2024-02-25 DIAGNOSIS — Z3042 Encounter for surveillance of injectable contraceptive: Secondary | ICD-10-CM | POA: Diagnosis not present

## 2024-02-25 LAB — POCT URINE PREGNANCY: Preg Test, Ur: NEGATIVE

## 2024-02-25 MED ORDER — MEDROXYPROGESTERONE ACETATE 150 MG/ML IM SUSP
150.0000 mg | Freq: Once | INTRAMUSCULAR | Status: AC
  Administered 2024-02-25: 150 mg via INTRAMUSCULAR

## 2024-02-25 NOTE — Progress Notes (Signed)
    NURSE VISIT NOTE  Subjective:    Patient ID: Sarah Kennedy, female    DOB: 11-06-2000, 23 y.o.   MRN: [id number]  HPI  Patient is a 23 y.o. G64P2012 female who presents for depo provera  injection.   Objective:    BP 102/69   Pulse 80   Wt 192 lb 12.8 oz (87.5 kg)   BMI 35.26 kg/m   Last Annual: 03/26/23. Last pap: 03/26/23. Last Depo-Provera : 10/2023. Side Effects if any: NA. Serum HCG indicated? Yes, UPT Negative. Depo-Provera  150 mg IM given by: Sarah Kennedy, CMA. Site: Right Deltoid    Assessment:   1. Encounter for Depo-Provera  contraception      Plan:   Next appointment due between 05/12/24 and 05/26/24.    Sarah Kennedy, CMA

## 2024-02-25 NOTE — Patient Instructions (Signed)

## 2024-05-12 ENCOUNTER — Ambulatory Visit

## 2024-05-21 ENCOUNTER — Other Ambulatory Visit: Payer: Self-pay

## 2024-05-21 ENCOUNTER — Encounter: Payer: Self-pay | Admitting: Emergency Medicine

## 2024-05-21 ENCOUNTER — Ambulatory Visit

## 2024-05-21 ENCOUNTER — Emergency Department
Admission: EM | Admit: 2024-05-21 | Discharge: 2024-05-21 | Disposition: A | Discharge status: Home / Self Care | Attending: Emergency Medicine | Admitting: Emergency Medicine

## 2024-05-21 DIAGNOSIS — R519 Headache, unspecified: Secondary | ICD-10-CM | POA: Diagnosis present

## 2024-05-21 NOTE — ED Triage Notes (Signed)
 Pt arrives via EMS from home for c/o onset left sided HA upon awakening this morning; st hx of same and took 2-600mg  ibuprofen ; has EEG sched at [facility] later today

## 2024-05-21 NOTE — ED Provider Notes (Signed)
   [facility] Provider Note    Event Date/Time   First MD Initiated Contact with Patient 05/21/24 0540     (approximate)   History   Headache   HPI  Sarah Kennedy is a 23 y.o. female who presents to the ED for evaluation of Headache   Review a neurology clinic visit from May.  Witnessed seizure-like episode, upcoming EEG.  Remains on Keppra  and Lexapro.  Patient presents to the ED after she had headache earlier today.  She took ibuprofen  and reports that it resolved.  She reports feeling fine now.   Physical Exam   Triage Vital Signs: ED Triage Vitals  Encounter Vitals Group     BP 05/21/24 0247 115/73     Girls Systolic BP Percentile --      Girls Diastolic BP Percentile --      Boys Systolic BP Percentile --      Boys Diastolic BP Percentile --      Pulse Rate 05/21/24 0247 69     Resp 05/21/24 0247 18     Temp 05/21/24 0247 98.3 F (36.8 C)     Temp src --      SpO2 05/21/24 0247 99 %     Weight 05/21/24 0232 190 lb (86.2 kg)     Height 05/21/24 0232 5' 2 (1.575 m)     Head Circumference --      Peak Flow --      Pain Score 05/21/24 0232 10     Pain Loc --      Pain Education --      Exclude from Growth Chart --     Most recent vital signs: Vitals:   05/21/24 0247 05/21/24 0608  BP: 115/73 91/60  Pulse: 69 68  Resp: 18 18  Temp: 98.3 F (36.8 C)   SpO2: 99% 100%    General: Awake, no distress.  CV:  Good peripheral perfusion.  Resp:  Normal effort.  Abd:  No distention.  MSK:  No deformity noted.  Neuro:  No focal deficits appreciated. Cranial nerves II through XII intact 5/5 strength and sensation in all 4 extremities Other:     ED Results / Procedures / Treatments   Labs (all labs ordered are listed, but only abnormal results are displayed) Labs Reviewed - No data to display  EKG   RADIOLOGY   Official radiology report(s): No results found.  PROCEDURES and INTERVENTIONS:  Procedures  Medications -  No data to display   IMPRESSION / MDM / ASSESSMENT AND PLAN / ED COURSE  I reviewed the triage vital signs and the nursing notes.  Differential diagnosis includes, but is not limited to, ICH, IIH, seizure, trauma  {Patient presents with symptoms of an acute illness or injury that is potentially life-threatening.  Patient presents after a resolved headache.  Normal exam without evidence of neurologic deficits, trauma or concerning features.  Suitable for outpatient management.      FINAL CLINICAL IMPRESSION(S) / ED DIAGNOSES   Final diagnoses:  Bad headache     Rx / DC Orders   ED Discharge Orders     None        Note:  This document was prepared using Dragon voice recognition software and may include unintentional dictation errors.   Claudene Rover, MD 05/21/24 [phone]

## 2024-05-21 NOTE — Discharge Instructions (Addendum)
Please take Tylenol and ibuprofen/Advil for your pain.  It is safe to take them together, or to alternate them every few hours.  Take up to 1000mg of Tylenol at a time, up to 4 times per day.  Do not take more than 4000 mg of Tylenol in 24 hours.  For ibuprofen, take 400-600 mg, 3 - 4 times per day.  

## 2024-06-10 NOTE — Progress Notes (Signed)
 Today the history is gathered from: 100% - patient  0% - alone today  RECORDS SUMMARY: Patient is self-referred.  REFERRING PHYSICIAN: Pcp PRIMARY CARE PHYSICIAN:  Provider   IMPRESSION/PLAN  Sarah Kennedy is a 23 y.o. female presenting for evaluation of  SEIZURE LIKE ACTIVITY/ HEADACHE/ ANXIETY - Slightly worse. - Patient reports 1 seizure like event that occurred about 2 months ago. She reports feeling weird in her sleep.  Patient reports whole body jerking. Lasted about 1.5 minutes. -Takes Keppra  500 mg in the morning and 1000 mg at night, and Lexapro 10 mg at night. - Patient with recent ER visit due to severe headache.  Call if headaches worsen.  - Reviewed  EEG from 05/21/24 was within normal limits.  - Recommend 3-4 hour EEG and 72 hour to rule out seizure disorder - Will continue to monitor anxiety. - Refill Lexapro 10 mg daily. Could consider increasing at next visit. - Refill Keppra  500 mg in the morning, and 1000 mg at night.     Medications previously tried:  Follow-up with Dr. Lane in 2 months  p=4   CHIEF COMPLAINT & HPI  Sarah Kennedy is a 23 y.o. female presenting for evaluation of: Chief Complaint  Patient presents with  . SEIZURE LIKE ACTIVITY/  . Headache    SEIZURE LIKE ACTIVITY/ HEADACHE Patient reports 1 seizure like event that occurred about 2 months ago. She reports feeling weird in her sleep, and when she woke up she was trying to say her significant others name, and then locked up. Patient reports whole body jerking.  Lasted about 1.5 minutes. Denies tongue biting or incontinence. Patient with recent ER visit due to severe headache. Headache has resolved. She does report severe headaches intermittently. She had an EEG completed on 08/14/205. Takes Keppra  500 mg in the morning and 1000 mg at night.   DATA SUMMARY: 05/21/2004 EEG: Routine EEG within normal limits.    08/15/2023 3 HOUR EEG IMPRESSION:  This extended in-office continuously  monitored video EEG in the awake and asleep states is within normal limits.   07/04/2023 ROUTINE EEG IMPRESSION:  This routine EEG is within normal limits.   04/08/2023 EEG Adult IMPRESSION:  This study showed evidence of epileptogenicity arising from right  centro-temporal region. No seizures were seen throughout the  recording.   04/07/2023 MRI BRAIN WO IMPRESSION:  Unremarkable appearance of the brain.    04/07/2023 CT HEAD WO IMPRESSION:  Normal head CT.   VISIT SUMMARIES:   MEDICATIONS Current Outpatient Medications  Medication Sig Dispense Refill  . escitalopram oxalate (LEXAPRO) 10 MG tablet Take 1 tablet (10 mg total) by mouth once daily for 90 days 30 tablet 2  . levETIRAcetam  (KEPPRA ) 500 MG tablet Take 500 mg in the morning and 1000 mg at night and continue that dose 90 tablet 0  . cephalexin (KEFLEX) 500 MG capsule Take 1,000 mg by mouth 2 (two) times daily (Patient not taking: Reported on 03/04/2024)    . folic acid (FOLVITE) 1 MG tablet Take 2 mg by mouth once daily (Patient not taking: Reported on 06/10/2024)    . levETIRAcetam  (KEPPRA ) 500 MG tablet Take 500 mg in the morning and 1000 mg at night. 270 tablet 1  . prenatal vitamin with iron-folic acid (PRENATAL TABLETS) tablet Take 1 tablet by mouth once daily (Patient not taking: Reported on 06/10/2024)     No current facility-administered medications for this visit.    ALLERGIES No Known Allergies   EXAM   Vitals:  06/10/24 1339  BP: 114/74  Weight: 81.6 kg (180 lb)  Height: 154.9 cm (5' 1)  PainSc: 0-No pain      Body mass index is 34.01 kg/m.  GENERAL: Pleasant female. NAD.  Normocephalic and atraumatic.  EYES: PERRLA EOM's intact  MUSCULOSKELETAL: Bulk - Normal Tone - Normal Pronator Drift - Absent bilaterally. Ambulation - Gait and station is steady  Romberg - deferred   R/L 5/5    Shoulder abduction (deltoid/supraspinatus, axillary/suprascapular n, C5) 5/5    Elbow flexion  (biceps brachii, musculoskeletal n, C5-6) 5/5    Elbow extension (triceps, radial n, C7) 5/5    Finger adduction (interossei, ulnar n, T1)  5/5    Hip flexion (iliopsoas, L1/L2) 5/5    Knee flexion (hamstrings, sciatic n, L5/S1)  5/5    Knee extension (quadriceps, femoral n, L3/4) 5/5    Ankle dorsiflexion (tibialis anterior, deep fibular n, L4/5) 5/5    Ankle plantarflexion (gastroc, tibial n, S1)   NEUROLOGICAL: MENTAL STATUS: Patient is oriented to person, place and time.   Short-term memory is intact Long-term memory is intact.   Attention span and concentration are intact.   Naming and repetition are intact. Comprehension is intact.   Expressive speech is intact.   Patient's fund of knowledge is within normal limits for educational level.  CRANIAL NERVES: Visual acuity and visual fields are intact         Extraocular muscles are intact                        Facial sensation is intact bilaterally                Facial strength is intact bilaterally                   Hearing is intact bilaterally                              Palate elevates midline, normal phonation     Shoulder shrug strength is intact                    Tongue protrudes midline                       SENSATION: Pain and temperature (spinothalamic tracts) is normal. Position and vibration (dorsal columns) is normal.  COORDINATION/CEREBELLAR: Finger tapping is intact.  PAST MEDICAL HISTORY Past Medical History:  Diagnosis Date  . Asthma, unspecified asthma severity, unspecified whether complicated, unspecified whether persistent ([facility]HS-HCC)     PAST SURGICAL HISTORY Past Surgical History:  Procedure Laterality Date  . spontaneous vaginal delivery  09/17/2023  . cholecysectomy      FAMILY HISTORY Family History  Problem Relation Name Age of Onset  . No Known Problems Mother    . No Known Problems Father      SOCIAL HISTORY  Social History   Tobacco Use  . Smoking status: Never  .  Smokeless tobacco: Never  Vaping Use  . Vaping status: Never Used  Substance Use Topics  . Alcohol use: Yes    Comment: seldom  . Drug use: Never     REVIEW OF SYSTEMS:  13 system ROS was verbally reviewed with patient. Pertinent positives and negatives are mentioned above in the HPI and all other systems are negative.  DATA  I have personally reviewed all of the data  outlined below both prior to the appointment and during the appointment with the patient as appropriate.  No visits with results within 6 Month(s) from this visit.  Latest known visit with results is:  Initial consult on 05/10/2023  Component Date Value Ref Range Status  . Levetiracetam , S - [facility] 05/10/2023 4.0 (L)  10.0 - 40.0 ug/mL Final  . Glucose 05/10/2023 86  70 - 110 mg/dL Final  . Sodium 91/97/7975 137  136 - 145 mmol/L Final  . Potassium 05/10/2023 3.7  3.6 - 5.1 mmol/L Final  . Chloride 05/10/2023 103  97 - 109 mmol/L Final  . Carbon Dioxide (CO2) 05/10/2023 25.6  22.0 - 32.0 mmol/L Final  . Urea Nitrogen (BUN) 05/10/2023 6 (L)  7 - 25 mg/dL Final  . Creatinine 91/97/7975 0.6  0.6 - 1.1 mg/dL Final  . Glomerular Filtration Rate (eGFR) 05/10/2023 130  >60 mL/min/1.73sq m Final  . Calcium  05/10/2023 8.9  8.7 - 10.3 mg/dL Final  . AST  91/97/7975 6 (L)  8 - 39 U/L Final  . ALT  05/10/2023 9  5 - 38 U/L Final  . Alk Phos (alkaline Phosphatase) 05/10/2023 44  34 - 104 U/L Final  . Albumin 05/10/2023 3.7  3.5 - 4.8 g/dL Final  . Bilirubin, Total 05/10/2023 0.3  0.3 - 1.2 mg/dL Final  . Protein, Total 05/10/2023 6.1  6.1 - 7.9 g/dL Final  . A/G Ratio 91/97/7975 1.5  1.0 - 5.0 gm/dL Final     No follow-ups on file.  Payor: BCBS Scientist, physiological / Plan: [place] [facility] HEALTHY BLUE / Product Type: Medicaid /    This note is partially written by Duwaine Pesa in the presence of and acting as the scribe of  Lauraine Rocks, PA- C  I have reviewed, edited and added to the note as needed to reflect my best personal medical  judgment.    Dr. Arthea Farrow, MD [facility] [place], [place] Ph:  [phone] Fax:  [phone]

## 2024-07-10 ENCOUNTER — Other Ambulatory Visit: Payer: Self-pay

## 2024-07-10 ENCOUNTER — Observation Stay
Admission: EM | Admit: 2024-07-10 | Discharge: 2024-07-10 | Disposition: A | Discharge status: Home / Self Care | Attending: Internal Medicine | Admitting: Internal Medicine

## 2024-07-10 DIAGNOSIS — G4089 Other seizures: Secondary | ICD-10-CM | POA: Diagnosis not present

## 2024-07-10 DIAGNOSIS — Z6839 Body mass index (BMI) 39.0-39.9, adult: Secondary | ICD-10-CM | POA: Insufficient documentation

## 2024-07-10 DIAGNOSIS — E669 Obesity, unspecified: Secondary | ICD-10-CM | POA: Insufficient documentation

## 2024-07-10 DIAGNOSIS — G40909 Epilepsy, unspecified, not intractable, without status epilepticus: Secondary | ICD-10-CM

## 2024-07-10 DIAGNOSIS — R569 Unspecified convulsions: Principal | ICD-10-CM

## 2024-07-10 DIAGNOSIS — R739 Hyperglycemia, unspecified: Secondary | ICD-10-CM | POA: Insufficient documentation

## 2024-07-10 DIAGNOSIS — J45909 Unspecified asthma, uncomplicated: Secondary | ICD-10-CM | POA: Diagnosis not present

## 2024-07-10 LAB — URINALYSIS, ROUTINE W REFLEX MICROSCOPIC
Bilirubin Urine: NEGATIVE
Glucose, UA: NEGATIVE mg/dL
Hgb urine dipstick: NEGATIVE
Ketones, ur: NEGATIVE mg/dL
Leukocytes,Ua: NEGATIVE
Nitrite: NEGATIVE
Protein, ur: NEGATIVE mg/dL
Specific Gravity, Urine: 1.011 (ref 1.005–1.030)
pH: 6 (ref 5.0–8.0)

## 2024-07-10 LAB — BASIC METABOLIC PANEL WITH GFR
Anion gap: 6 (ref 5–15)
BUN: 15 mg/dL (ref 6–20)
CO2: 21 mmol/L — ABNORMAL LOW (ref 22–32)
Calcium: 8.2 mg/dL — ABNORMAL LOW (ref 8.9–10.3)
Chloride: 110 mmol/L (ref 98–111)
Creatinine, Ser: 0.9 mg/dL (ref 0.44–1.00)
GFR, Estimated: >60 mL/min (ref >60)
Glucose, Bld: 127 mg/dL — ABNORMAL HIGH (ref 70–99)
Potassium: 3.7 mmol/L (ref 3.5–5.1)
Sodium: 137 mmol/L (ref 135–145)

## 2024-07-10 LAB — URINE DRUG SCREEN, QUALITATIVE (ARMC ONLY)
Amphetamines, Ur Screen: NOT DETECTED
Barbiturates, Ur Screen: NOT DETECTED
Benzodiazepine, Ur Scrn: NOT DETECTED
Cannabinoid 50 Ng, Ur ~~LOC~~: NOT DETECTED
Cocaine Metabolite,Ur ~~LOC~~: NOT DETECTED
MDMA (Ecstasy)Ur Screen: NOT DETECTED
Methadone Scn, Ur: NOT DETECTED
Opiate, Ur Screen: NOT DETECTED
Phencyclidine (PCP) Ur S: NOT DETECTED
Tricyclic, Ur Screen: NOT DETECTED

## 2024-07-10 LAB — CBC WITH DIFFERENTIAL/PLATELET
Abs Immature Granulocytes: 0.07 K/uL (ref 0.00–0.07)
Basophils Absolute: 0.1 K/uL (ref 0.0–0.1)
Basophils Relative: 1 %
Eosinophils Absolute: 0.2 K/uL (ref 0.0–0.5)
Eosinophils Relative: 1 %
HCT: 36.2 % (ref 36.0–46.0)
Hemoglobin: 12.8 g/dL (ref 12.0–15.0)
Immature Granulocytes: 1 %
Lymphocytes Relative: 15 %
Lymphs Abs: 2 K/uL (ref 0.7–4.0)
MCH: 29.9 pg (ref 26.0–34.0)
MCHC: 35.4 g/dL (ref 30.0–36.0)
MCV: 84.6 fL (ref 80.0–100.0)
Monocytes Absolute: 0.7 K/uL (ref 0.1–1.0)
Monocytes Relative: 6 %
Neutro Abs: 10.4 K/uL — ABNORMAL HIGH (ref 1.7–7.7)
Neutrophils Relative %: 76 %
Platelets: 299 K/uL (ref 150–400)
RBC: 4.28 MIL/uL (ref 3.87–5.11)
RDW: 12.5 % (ref 11.5–15.5)
WBC: 13.4 K/uL — ABNORMAL HIGH (ref 4.0–10.5)
nRBC: 0 % (ref 0.0–0.2)

## 2024-07-10 LAB — COMPREHENSIVE METABOLIC PANEL WITH GFR
ALT: 22 U/L (ref 0–44)
AST: 18 U/L (ref 15–41)
Albumin: 3.5 g/dL (ref 3.5–5.0)
Alkaline Phosphatase: 51 U/L (ref 38–126)
Anion gap: 9 (ref 5–15)
BUN: 13 mg/dL (ref 6–20)
CO2: 25 mmol/L (ref 22–32)
Calcium: 8.8 mg/dL — ABNORMAL LOW (ref 8.9–10.3)
Chloride: 103 mmol/L (ref 98–111)
Creatinine, Ser: 0.82 mg/dL (ref 0.44–1.00)
GFR, Estimated: >60 mL/min (ref >60)
Glucose, Bld: 107 mg/dL — ABNORMAL HIGH (ref 70–99)
Potassium: 3.8 mmol/L (ref 3.5–5.1)
Sodium: 137 mmol/L (ref 135–145)
Total Bilirubin: 0.4 mg/dL (ref 0.0–1.2)
Total Protein: 6.6 g/dL (ref 6.5–8.1)

## 2024-07-10 LAB — CBC
HCT: 34.3 % — ABNORMAL LOW (ref 36.0–46.0)
Hemoglobin: 12 g/dL (ref 12.0–15.0)
MCH: 29.9 pg (ref 26.0–34.0)
MCHC: 35 g/dL (ref 30.0–36.0)
MCV: 85.3 fL (ref 80.0–100.0)
Platelets: 279 K/uL (ref 150–400)
RBC: 4.02 MIL/uL (ref 3.87–5.11)
RDW: 12.5 % (ref 11.5–15.5)
WBC: 15.9 K/uL — ABNORMAL HIGH (ref 4.0–10.5)
nRBC: 0 % (ref 0.0–0.2)

## 2024-07-10 LAB — CBG MONITORING, ED: Glucose-Capillary: 108 mg/dL — ABNORMAL HIGH (ref 70–99)

## 2024-07-10 LAB — ETHANOL: Alcohol, Ethyl (B): <15 mg/dL (ref <15)

## 2024-07-10 LAB — HCG, QUANTITATIVE, PREGNANCY: hCG, Beta Chain, Quant, S: <1 m[IU]/mL (ref <5)

## 2024-07-10 LAB — HIV ANTIBODY (ROUTINE TESTING W REFLEX): HIV Screen 4th Generation wRfx: NONREACTIVE

## 2024-07-10 MED ORDER — SODIUM CHLORIDE 0.9 % IV BOLUS (SEPSIS)
1000.0000 mL | Freq: Once | INTRAVENOUS | Status: AC
  Administered 2024-07-10: 1000 mL via INTRAVENOUS

## 2024-07-10 MED ORDER — LEVETIRACETAM 500 MG PO TABS
500.0000 mg | ORAL_TABLET | Freq: Every morning | ORAL | Status: DC
  Administered 2024-07-10: 500 mg via ORAL
  Filled 2024-07-10: qty 1

## 2024-07-10 MED ORDER — ALBUTEROL SULFATE (2.5 MG/3ML) 0.083% IN NEBU: 2.5000 mg | INHALATION_SOLUTION | RESPIRATORY_TRACT | Status: DC | PRN

## 2024-07-10 MED ORDER — LEVETIRACETAM (KEPPRA) 500 MG/5 ML ADULT IV PUSH
1000.0000 mg | Freq: Once | INTRAVENOUS | Status: AC
  Administered 2024-07-10: 1000 mg via INTRAVENOUS
  Filled 2024-07-10: qty 10

## 2024-07-10 MED ORDER — LEVETIRACETAM (KEPPRA) 500 MG/5 ML ADULT IV PUSH: 1000.0000 mg | Freq: Two times a day (BID) | INTRAVENOUS | Status: DC

## 2024-07-10 MED ORDER — ESCITALOPRAM OXALATE 10 MG PO TABS
10.0000 mg | ORAL_TABLET | Freq: Every day | ORAL | Status: DC
  Filled 2024-07-10: qty 1

## 2024-07-10 MED ORDER — LEVETIRACETAM 1000 MG PO TABS: 1000.0000 mg | ORAL_TABLET | Freq: Every evening | ORAL | Status: AC

## 2024-07-10 MED ORDER — LEVETIRACETAM 500 MG PO TABS
500.0000 mg | ORAL_TABLET | Freq: Two times a day (BID) | ORAL | Status: DC
Start: 2024-07-10 — End: 2024-07-10

## 2024-07-10 MED ORDER — ENOXAPARIN SODIUM 40 MG/0.4ML IJ SOSY: 40.0000 mg | PREFILLED_SYRINGE | INTRAMUSCULAR | Status: DC

## 2024-07-10 MED ORDER — LEVETIRACETAM 500 MG PO TABS: 1000.0000 mg | ORAL_TABLET | Freq: Every evening | ORAL | Status: DC

## 2024-07-10 MED ORDER — LEVETIRACETAM 500 MG PO TABS: 500.0000 mg | ORAL_TABLET | Freq: Every morning | ORAL | Status: AC

## 2024-07-10 NOTE — ED Triage Notes (Signed)
 Hx of seizures, husband states to EMS missed Keppra  dose tonight.  Pt had first seizure while sleeping. Husband gave Keppra  dose and pt went back to sleep, 2nd seizure aprox 45 minutes later.

## 2024-07-10 NOTE — Progress Notes (Signed)
   07/10/24 1345  Spiritual Encounters  Type of Visit Initial  Care provided to: Pt and family  Referral source Other (comment) ([facility])  Reason for visit Advance directives  OnCall Visit No   Chaplain visited patient per AD [place] [facility] request.  Chaplain explained the document to patient with loved one at bedside.  Patient shared she's about to be discharged so, Chaplain shared patient can do the AD and notary outside of the hospital and take the packet with her.  Patient and loved one had no other needs at this time.    Rev. Rana M. Nicholaus, M.Div. Chaplain Resident [facility]

## 2024-07-10 NOTE — H&P (Signed)
 History and Physical    Patient: Sarah Kennedy FMW:[id number] DOB: Mar 05, 2001 DOA: 07/10/2024 DOS: the patient was seen and examined on 07/10/2024 [facility]  Patient coming from: Home  Chief Complaint: Seizure Chief Complaint  Patient presents with   Seizures   HPI: WILLARD MADRIGAL is a 23 y.o. female with medical history significant of seizure on Keppra  who was otherwise well until this evening while laying in bed husband observed her to have tonic-clonic seizures.  She takes 500 mg of Keppra  in the morning and 1000 mg at night.  Patient missed the evening dose of her Keppra .  She managed to take 1000 mg after the first seizure however she did have a second seizure episode and therefore patient brought to the emergency room for further management.  Patient did have a postictal state and later regained full mental status.  Denies nausea vomiting abdominal pain chest pain cough.  Did not have bowel or urinary incontinence during the episode.  ED course: Upon presentation to the emergency room vitals showed temperature 98.7, respiratory rate 23, pulse 94, BP 88/52 saturating 100% on room air.  Giving recurrent seizures [facility] was contacted to admit patient for observation and monitoring as we await full drug levels.  Review of Systems: As mentioned in the history of present illness. All other systems reviewed and are negative. Past Medical History:  Diagnosis Date   Late prenatal care    @32  weeks   Seizure disorder in pregnancy, antepartum, second trimester (HCC) 04/07/2023   Teen pregnancy    Past Surgical History:  Procedure Laterality Date   CHOLECYSTECTOMY     Social History:  reports that she has never smoked. She has never used smokeless tobacco. She reports that she does not drink alcohol and does not use drugs.  No Known Allergies  Family History  Problem Relation Age of Onset   Healthy Mother    Healthy Father    Healthy Sister    Healthy  Brother    Clotting disorder Maternal Grandmother    Healthy Maternal Grandfather    Healthy Paternal Grandmother    Healthy Paternal Grandfather     Prior to Admission medications   Medication Sig Start Date End Date Taking? Authorizing Provider  acetaminophen  (TYLENOL ) 325 MG tablet Take 650 mg by mouth every 6 (six) hours as needed. 02/07/16   [provider]  albuterol  (VENTOLIN  HFA) 108 (90 Base) MCG/ACT inhaler Inhale 2 puffs into the lungs every 6 (six) hours as needed for wheezing or shortness of breath. Patient not taking: Reported on 05/21/2023 01/13/21   Bradler, Evan K, MD  levETIRAcetam  (KEPPRA ) 500 MG tablet Take 1 tablet (500 mg total) by mouth 2 (two) times daily. 04/08/23   Carlin Rollene HERO, CNM  metoCLOPramide  (REGLAN ) 10 MG tablet Take 1 tablet (10 mg total) by mouth every 8 (eight) hours as needed for nausea. Patient not taking: Reported on 05/21/2023 02/21/23 02/21/24  Dorothyann Drivers, MD  ondansetron  (ZOFRAN -ODT) 4 MG disintegrating tablet Take 4 mg by mouth every 8 (eight) hours as needed for nausea or vomiting. Patient not taking: Reported on 05/21/2023    [provider]  Prenatal Vit-Fe Fumarate-FA (PRENATAL MULTIVITAMIN) TABS tablet Take 1 tablet by mouth daily at 12 noon.    [provider]    Physical Exam: Vitals:   07/10/24 0121 07/10/24 0122 07/10/24 0200 07/10/24 0255  BP: 93/64  (!) 88/52 100/64  Pulse: (!) 109  94 (!) 109  Resp: 14  (!) 23 19  Temp: 98.7 F (37.1 C)   98.7 F (37.1 C)  TempSrc: Oral   Oral  SpO2: 97%  100% 98%  Weight:  97 kg    Height:  5' 2 (1.575 m)     Cardiovascular:     Rate and Rhythm: Normal rate and regular rhythm.     Pulses: Normal pulses.     Heart sounds: Normal heart sounds.  Pulmonary:     Effort: Pulmonary effort is normal. No respiratory distress.     Breath sounds: Normal breath sounds. No wheezing, rhonchi or rales.  Abdominal:     General: Abdomen is flat. Bowel sounds are normal.  There is no distension.     Palpations: Abdomen is soft.     Tenderness: There is no abdominal tenderness.  Musculoskeletal:        General: No deformity. Normal range of motion.  Skin:    General: Skin is warm and dry.   Neurological:     General: No focal deficit present.     Mental Status: He is alert and oriented to person, place, and time. Mental status is at baseline.  Data Reviewed:    Latest Ref Rng & Units 07/10/2024    1:15 AM 06/18/2023   10:14 AM 04/07/2023    9:17 AM  CBC  WBC 4.0 - 10.5 K/uL 13.4  11.6  10.5   Hemoglobin 12.0 - 15.0 g/dL 87.1  87.4  88.0   Hematocrit 36.0 - 46.0 % 36.2  38.4  33.7   Platelets 150 - 400 K/uL 299  259  257        Latest Ref Rng & Units 07/10/2024    1:15 AM 04/07/2023    9:17 AM 02/21/2023    4:37 AM  BMP  Glucose 70 - 99 mg/dL 892  887  886   BUN 6 - 20 mg/dL 13  5  7    Creatinine 0.44 - 1.00 mg/dL 9.17  9.49  9.47   Sodium 135 - 145 mmol/L 137  132  133   Potassium 3.5 - 5.1 mmol/L 3.8  3.0  3.3   Chloride 98 - 111 mmol/L 103  106  105   CO2 22 - 32 mmol/L 25  21  18    Calcium  8.9 - 10.3 mg/dL 8.8  8.3  9.1     Assessment and Plan:  Acute on chronic seizure Patient had 2 witnessed seizure at home She admits to missing her evening dose of Keppra  She follows up with [facility] Follow-up on Keppra  levels Consider neurologist consult pending Keppra  levels We will cover with IV Keppra  1 g twice daily Counseled on medication compliance Keep on seizure precaution   History of asthma Continue as needed albuterol    DVT prophylaxis-continue Lovenox   Advance Care Planning:   Code Status: Prior full code   Family Communication: Discussed with patient's husband at bedside  Severity of Illness: The appropriate patient status for this patient is OBSERVATION. Observation status is judged to be reasonable and necessary in order to provide the required intensity of service to ensure the patient's safety. The patient's  presenting symptoms, physical exam findings, and initial radiographic and laboratory data in the context of their medical condition is felt to place them at decreased risk for further clinical deterioration. Furthermore, it is anticipated that the patient will be medically stable for discharge from the hospital within 2 midnights of admission.   Author: Drue ONEIDA Potter,  MD 07/10/2024 3:30 AM  For on call review [email or web address].

## 2024-07-10 NOTE — Discharge Summary (Addendum)
 Physician Discharge Summary  Sarah Kennedy FMW:[id number] DOB: June 07, 2001 DOA: 07/10/2024  PCP: [facility]  Admit date: 07/10/2024 Discharge date: 07/10/2024  Admitted From: home  Disposition:  home   Recommendations for Outpatient Follow-up:  Follow up with PCP in 1-2 weeks F/u w/ neuro at previously scheduled appointment  Home Health: no  Equipment/Devices:  Discharge Condition: stable  CODE STATUS:full  Diet recommendation: Regular  Brief/Interim Summary: HPI was taken from Dr. Dorinda: Sarah Kennedy is a 23 y.o. female with medical history significant of seizure on Keppra  who was otherwise well until this evening while laying in bed husband observed her to have tonic-clonic seizures.  She takes 500 mg of Keppra  in the morning and 1000 mg at night.  Patient missed the evening dose of her Keppra .  She managed to take 1000 mg after the first seizure however she did have a second seizure episode and therefore patient brought to the emergency room for further management.  Patient did have a postictal state and later regained full mental status.  Denies nausea vomiting abdominal pain chest pain cough.  Did not have bowel or urinary incontinence during the episode.   ED course: Upon presentation to the emergency room vitals showed temperature 98.7, respiratory rate 23, pulse 94, BP 88/52 saturating 100% on room air.  Giving recurrent seizures [facility] was contacted to admit patient for observation and monitoring as we await full drug levels.  Discharge Diagnoses:  Principal Problem:   Acute repetitive seizure (HCC)  Acute on chronic seizure: 2 witnessed seizures at home. Admits to intermittently missing keppra  doses. Encourage medication compliance and setting an alarm to take keppra  BID. Pt verbalized her understanding. Will outpatient w/ neuro at previously scheduled appt  Hx of asthma: unknown stage and/or severity. Bronchodilators prn   Hyperglycemia: no hx of  DM. Should get HbA1c checked outpatient secondary to pt being obese  Obesity: BMI 39.1. Would benefit from weight loss  Discharge Instructions  Discharge Instructions     Diet general   Complete by: As directed    Discharge instructions   Complete by: As directed    F/u w/ PCP in 1-2 weeks. F/u w/ neuro at previously scheduled appointment.   Increase activity slowly   Complete by: As directed       Allergies as of 07/10/2024   No Known Allergies      Medication List     TAKE these medications    escitalopram 10 MG tablet Commonly known as: LEXAPRO Take 10 mg by mouth daily.   ibuprofen  600 MG tablet Commonly known as: ADVIL  Take 600 mg by mouth every 6 (six) hours as needed for mild pain (pain score 1-3).   levETIRAcetam  1000 MG tablet Commonly known as: KEPPRA  Take 1 tablet (1,000 mg total) by mouth every evening. What changed: You were already taking a medication with the same name, and this prescription was added. Make sure you understand how and when to take each.   levETIRAcetam  500 MG tablet Commonly known as: KEPPRA  Take 1 tablet (500 mg total) by mouth every morning. Start taking on: July 11, 2024 What changed: when to take this        No Known Allergies  Consultations:    Procedures/Studies: No results found. (Echo, Carotid, EGD, Colonoscopy, ERCP)    Subjective: Pt c/o malaise   Discharge Exam: Vitals:   07/10/24 0933 07/10/24 1150  BP: 100/60 (!) 104/58  Pulse: 89 76  Resp: 16 17  Temp: 98.4 F (36.9 C) 98 F (36.7 C)  SpO2: 100% 99%   Vitals:   07/10/24 0800 07/10/24 0914 07/10/24 0933 07/10/24 1150  BP: (!) 96/52 102/60 100/60 (!) 104/58  Pulse: 85 78 89 76  Resp:  17 16 17   Temp:  98.5 F (36.9 C) 98.4 F (36.9 C) 98 F (36.7 C)  TempSrc:  Oral    SpO2: 99% 98% 100% 99%  Weight:      Height:        General: Pt is alert, awake, not in acute distress. Obese Cardiovascular: S1/S2 +, no rubs, no  gallops Respiratory: CTA bilaterally, no wheezing, no rhonchi Abdominal: Soft, NT, obese, bowel sounds + Extremities: no edema, no cyanosis    The results of significant diagnostics from this hospitalization (including imaging, microbiology, ancillary and laboratory) are listed below for reference.     Microbiology: No results found for this or any previous visit (from the past 240 hours).   Labs: BNP (last 3 results) No results for input(s): BNP in the last 8760 hours. Basic Metabolic Panel: Recent Labs  Lab 07/10/24 0115 07/10/24 0415  NA 137 137  K 3.8 3.7  CL 103 110  CO2 25 21*  GLUCOSE 107* 127*  BUN 13 15  CREATININE 0.82 0.90  CALCIUM  8.8* 8.2*   Liver Function Tests: Recent Labs  Lab 07/10/24 0115  AST 18  ALT 22  ALKPHOS 51  BILITOT 0.4  PROT 6.6  ALBUMIN 3.5   No results for input(s): LIPASE, AMYLASE in the last 168 hours. No results for input(s): AMMONIA  in the last 168 hours. CBC: Recent Labs  Lab 07/10/24 0115 07/10/24 0415  WBC 13.4* 15.9*  NEUTROABS 10.4*  --   HGB 12.8 12.0  HCT 36.2 34.3*  MCV 84.6 85.3  PLT 299 279   Cardiac Enzymes: No results for input(s): CKTOTAL, CKMB, CKMBINDEX, TROPONINI in the last 168 hours. BNP: Invalid input(s): POCBNP CBG: Recent Labs  Lab 07/10/24 0138  GLUCAP 108*   D-Dimer No results for input(s): DDIMER in the last 72 hours. Hgb A1c No results for input(s): HGBA1C in the last 72 hours. Lipid Profile No results for input(s): CHOL, HDL, LDLCALC, TRIG, CHOLHDL, LDLDIRECT in the last 72 hours. Thyroid function studies No results for input(s): TSH, T4TOTAL, T3FREE, THYROIDAB in the last 72 hours.  Invalid input(s): FREET3 Anemia work up No results for input(s): VITAMINB12, FOLATE, FERRITIN, TIBC, IRON, RETICCTPCT in the last 72 hours. Urinalysis    Component Value Date/Time   COLORURINE STRAW (A) 07/10/2024 0258   APPEARANCEUR CLEAR (A)  07/10/2024 0258   APPEARANCEUR Cloudy (A) 03/12/2023 1544   LABSPEC 1.011 07/10/2024 0258   LABSPEC 1.031 03/18/2014 1321   PHURINE 6.0 07/10/2024 0258   GLUCOSEU NEGATIVE 07/10/2024 0258   GLUCOSEU Negative 03/18/2014 1321   HGBUR NEGATIVE 07/10/2024 0258   BILIRUBINUR NEGATIVE 07/10/2024 0258   BILIRUBINUR Negative 08/08/2023 1631   BILIRUBINUR CANCELED 03/12/2023 1544   BILIRUBINUR Negative 03/18/2014 1321   KETONESUR NEGATIVE 07/10/2024 0258   PROTEINUR NEGATIVE 07/10/2024 0258   UROBILINOGEN 0.2 08/08/2023 1631   NITRITE NEGATIVE 07/10/2024 0258   LEUKOCYTESUR NEGATIVE 07/10/2024 0258   LEUKOCYTESUR Trace 03/18/2014 1321   Sepsis Labs Recent Labs  Lab 07/10/24 0115 07/10/24 0415  WBC 13.4* 15.9*   Microbiology No results found for this or any previous visit (from the past 240 hours).   Time coordinating discharge: 34 minutes  SIGNED:   Anthony CHRISTELLA Pouch, MD  [facility]  07/10/2024, 1:11 PM Pager   If 7PM-7AM, please contact night-coverage [email or web address]

## 2024-07-10 NOTE — ED Provider Notes (Signed)
 [facility] Provider Note    Event Date/Time   First MD Initiated Contact with Patient 07/10/24 0113     (approximate)   History   Seizures   HPI  OLAYINKA GATHERS is a 23 y.o. female with history of seizures on Keppra  500 mg in the morning, 1000 mg at night followed by Dr. Lane with [facility] who presents to the emergency department with 2 witnessed seizures at home that occurred while in bed.  Significant other describe them as generalized tonic-clonic.  Postictal afterwards.  Now alert and oriented.  Denies any pain.  States she did miss her nightly dose of Keppra  tonight but has otherwise been compliant.  She did take her 1000 mg dose in between seizures tonight.  She denies fevers, cough, vomiting, diarrhea, drug or alcohol use.  No headache or head injury.  Not on blood thinners.   History provided by patient, EMS.    Past Medical History:  Diagnosis Date   Late prenatal care    @32  weeks   Seizure disorder in pregnancy, antepartum, second trimester (HCC) 04/07/2023   Teen pregnancy     Past Surgical History:  Procedure Laterality Date   CHOLECYSTECTOMY      MEDICATIONS:  Prior to Admission medications   Medication Sig Start Date End Date Taking? Authorizing Provider  acetaminophen  (TYLENOL ) 325 MG tablet Take 650 mg by mouth every 6 (six) hours as needed. 02/07/16   [provider]  albuterol  (VENTOLIN  HFA) 108 (90 Base) MCG/ACT inhaler Inhale 2 puffs into the lungs every 6 (six) hours as needed for wheezing or shortness of breath. Patient not taking: Reported on 05/21/2023 01/13/21   Bradler, Evan K, MD  levETIRAcetam  (KEPPRA ) 500 MG tablet Take 1 tablet (500 mg total) by mouth 2 (two) times daily. 04/08/23   Carlin Rollene HERO, CNM  metoCLOPramide  (REGLAN ) 10 MG tablet Take 1 tablet (10 mg total) by mouth every 8 (eight) hours as needed for nausea. Patient not taking: Reported on 05/21/2023 02/21/23 02/21/24  Dorothyann Drivers, MD   ondansetron  (ZOFRAN -ODT) 4 MG disintegrating tablet Take 4 mg by mouth every 8 (eight) hours as needed for nausea or vomiting. Patient not taking: Reported on 05/21/2023    [provider]  Prenatal Vit-Fe Fumarate-FA (PRENATAL MULTIVITAMIN) TABS tablet Take 1 tablet by mouth daily at 12 noon.    [provider]    Physical Exam   Triage Vital Signs: ED Triage Vitals  Encounter Vitals Group     BP 07/10/24 0121 93/64     Girls Systolic BP Percentile --      Girls Diastolic BP Percentile --      Boys Systolic BP Percentile --      Boys Diastolic BP Percentile --      Pulse Rate 07/10/24 0121 (!) 109     Resp 07/10/24 0121 14     Temp 07/10/24 0121 98.7 F (37.1 C)     Temp Source 07/10/24 0121 Oral     SpO2 07/10/24 0121 97 %     Weight 07/10/24 0122 213 lb 13.5 oz (97 kg)     Height 07/10/24 0122 5' 2 (1.575 m)     Head Circumference --      Peak Flow --      Pain Score 07/10/24 0121 7     Pain Loc --      Pain Education --      Exclude from Growth Chart --  Most recent vital signs: Vitals:   07/10/24 0121  BP: 93/64  Pulse: (!) 109  Resp: 14  Temp: 98.7 F (37.1 C)  SpO2: 97%    CONSTITUTIONAL: Alert, responds appropriately to questions. Well-appearing; well-nourished, oriented x 3 HEAD: Normocephalic, atraumatic EYES: Conjunctivae clear, pupils appear equal, sclera nonicteric ENT: normal nose; moist mucous membranes NECK: Supple, normal ROM, no meningismus, no midline spinal tenderness or step-off or deformity CARD: Regular and tachycardic; S1 and S2 appreciated RESP: Normal chest excursion without splinting or tachypnea; breath sounds clear and equal bilaterally; no wheezes, no rhonchi, no rales, no hypoxia or respiratory distress, speaking full sentences ABD/GI: Non-distended; soft, non-tender, no rebound, no guarding, no peritoneal signs BACK: The back appears normal EXT: Normal ROM in all joints; no deformity noted, no edema SKIN:  Normal color for age and race; warm; no rash on exposed skin NEURO: Moves all extremities equally, normal speech, no facial asymmetry, normal sensation PSYCH: The patient's mood and manner are appropriate.   ED Results / Procedures / Treatments   LABS: (all labs ordered are listed, but only abnormal results are displayed) Labs Reviewed  CBC WITH DIFFERENTIAL/PLATELET - Abnormal; Notable for the following components:      Result Value   WBC 13.4 (*)    Neutro Abs 10.4 (*)    All other components within normal limits  COMPREHENSIVE METABOLIC PANEL WITH GFR - Abnormal; Notable for the following components:   Glucose, Bld 107 (*)    Calcium  8.8 (*)    All other components within normal limits  CBG MONITORING, ED - Abnormal; Notable for the following components:   Glucose-Capillary 108 (*)    All other components within normal limits  ETHANOL  HCG, QUANTITATIVE, PREGNANCY  URINALYSIS, ROUTINE W REFLEX MICROSCOPIC  URINE DRUG SCREEN, QUALITATIVE ([facility] ONLY)  LEVETIRACETAM  LEVEL     EKG:  EKG Interpretation Date/Time:  Friday July 10 2024 01:16:16 EDT Ventricular Rate:  102 PR Interval:  181 QRS Duration:  82 QT Interval:  330 QTC Calculation: 430 R Axis:   90  Text Interpretation: Sinus tachycardia Borderline right axis deviation ST elev, probable normal early repol pattern Confirmed by Neomi Neptune [phone]) on 07/10/2024 1:35:05 AM         RADIOLOGY: My personal review and interpretation of imaging:    I have personally reviewed all radiology reports.   No results found.   PROCEDURES:  Critical Care performed: Yes, see critical care procedure note(s)   CRITICAL CARE Performed by: Neptune Neomi   Total critical care time: 30 minutes  Critical care time was exclusive of separately billable procedures and treating other patients.  Critical care was necessary to treat or prevent imminent or life-threatening deterioration.  Critical care was time spent  personally by me on the following activities: development of treatment plan with patient and/or surrogate as well as nursing, discussions with consultants, evaluation of patient's response to treatment, examination of patient, obtaining history from patient or surrogate, ordering and performing treatments and interventions, ordering and review of laboratory studies, ordering and review of radiographic studies, pulse oximetry and re-evaluation of patient's condition.   SABRA1-3 Lead EKG Interpretation  Performed by: Shaneece Stockburger, Neptune SAILOR, DO Authorized by: Ronit Marczak, Neptune SAILOR, DO     Interpretation: abnormal     ECG rate:  109   ECG rate assessment: tachycardic     Rhythm: sinus tachycardia     Ectopy: none     Conduction: normal       IMPRESSION /  MDM / ASSESSMENT AND PLAN / ED COURSE  I reviewed the triage vital signs and the nursing notes.    Patient here with 2 witnessed seizures.  Currently drowsy but alert, oriented and neurologically intact.  The patient is on the cardiac monitor to evaluate for evidence of arrhythmia and/or significant heart rate changes.   DIFFERENTIAL DIAGNOSIS (includes but not limited to):   Seizure, electrolyte derangement, anemia, infection, medical noncompliance   Patient's presentation is most consistent with acute presentation with potential threat to life or bodily function.   PLAN: Will obtain screening labs, urine.  Blood glucose normal.  Will give IV Keppra  and continue to monitor.  Given she has had 2 witnessed seizures at home, have recommended admission to the hospital for further monitoring.  She agrees.   MEDICATIONS GIVEN IN ED: Medications  levETIRAcetam  (KEPPRA ) undiluted injection 1,000 mg (1,000 mg Intravenous Given 07/10/24 0133)  sodium chloride  0.9 % bolus 1,000 mL (1,000 mLs Intravenous New Bag/Given 07/10/24 0139)     ED COURSE: Patient's labs show leukocytosis of 13,000 which is likely reactive.  She denies any infectious symptoms.   Normal electrolytes, LFTs.  Urine pending.  Patient still neurologically intact here, hemodynamically stable.  Will discuss with hospitalist for admission for observation.   CONSULTS:  Consulted and discussed patient's case with hospitalist, Dr. Dorinda.  I have recommended admission and consulting physician agrees and will place admission orders.  Patient (and family if present) agree with this plan.   I reviewed all nursing notes, vitals, pertinent previous records.  All labs, EKGs, imaging ordered have been independently reviewed and interpreted by myself.    OUTSIDE RECORDS REVIEWED: Reviewed Dr. Clement recent neurology notes.       FINAL CLINICAL IMPRESSION(S) / ED DIAGNOSES   Final diagnoses:  Seizure-like activity (HCC)     Rx / DC Orders   ED Discharge Orders     None        Note:  This document was prepared using Dragon voice recognition software and may include unintentional dictation errors.   Josy Peaden, Josette SAILOR, DO 07/10/24 [phone]

## 2024-07-10 NOTE — Progress Notes (Signed)
..  Brief Assessment Note  Patient is from home with seizures. Medical record reviewed and patient has no TOC needs at this time. Please outreach to [facility] if needs are identified.        Patient Goals and CMS Choice        Expected Discharge Plan and Services                                                Prior Living Arrangements/Services                       Activities of Daily Living   ADL Screening (condition at time of admission) Independently performs ADLs?: Yes (appropriate for developmental age) Is the patient deaf or have difficulty hearing?: No Does the patient have difficulty seeing, even when wearing glasses/contacts?: No Does the patient have difficulty concentrating, remembering, or making decisions?: No  Permission Sought/Granted                  Emotional Assessment              Admission diagnosis:  Seizure-like activity (HCC) [R56.9] Acute repetitive seizure (HCC) [G40.909] Patient Active Problem List   Diagnosis Date Noted   Acute repetitive seizure (HCC) 07/10/2024   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 [place] [address] Phone: [phone] Fax: [phone]     Social Determinants of Health (SDOH) Interventions    Readmission Risk Interventions     No data to display

## 2024-07-12 LAB — LEVETIRACETAM LEVEL: Levetiracetam Lvl: 6.8 ug/mL — ABNORMAL LOW (ref 10.0–40.0)

## 2024-07-15 ENCOUNTER — Other Ambulatory Visit: Payer: Self-pay

## 2024-07-15 ENCOUNTER — Emergency Department
Admission: EM | Admit: 2024-07-15 | Discharge: 2024-07-15 | Discharge status: Against Medical Advice | Attending: Emergency Medicine | Admitting: Emergency Medicine

## 2024-07-15 DIAGNOSIS — R509 Fever, unspecified: Secondary | ICD-10-CM | POA: Insufficient documentation

## 2024-07-15 DIAGNOSIS — Z5321 Procedure and treatment not carried out due to patient leaving prior to being seen by health care provider: Secondary | ICD-10-CM | POA: Diagnosis not present

## 2024-07-15 DIAGNOSIS — R519 Headache, unspecified: Secondary | ICD-10-CM | POA: Insufficient documentation

## 2024-07-15 LAB — CBC WITH DIFFERENTIAL/PLATELET
Abs Immature Granulocytes: 0.04 K/uL (ref 0.00–0.07)
Basophils Absolute: 0.1 K/uL (ref 0.0–0.1)
Basophils Relative: 1 %
Eosinophils Absolute: 0.1 K/uL (ref 0.0–0.5)
Eosinophils Relative: 1 %
HCT: 36.8 % (ref 36.0–46.0)
Hemoglobin: 12.8 g/dL (ref 12.0–15.0)
Immature Granulocytes: 0 %
Lymphocytes Relative: 9 %
Lymphs Abs: 0.9 K/uL (ref 0.7–4.0)
MCH: 29.5 pg (ref 26.0–34.0)
MCHC: 34.8 g/dL (ref 30.0–36.0)
MCV: 84.8 fL (ref 80.0–100.0)
Monocytes Absolute: 0.6 K/uL (ref 0.1–1.0)
Monocytes Relative: 6 %
Neutro Abs: 8.5 K/uL — ABNORMAL HIGH (ref 1.7–7.7)
Neutrophils Relative %: 83 %
Platelets: 264 K/uL (ref 150–400)
RBC: 4.34 MIL/uL (ref 3.87–5.11)
RDW: 12.1 % (ref 11.5–15.5)
WBC: 10.1 K/uL (ref 4.0–10.5)
nRBC: 0 % (ref 0.0–0.2)

## 2024-07-15 LAB — BASIC METABOLIC PANEL WITH GFR
Anion gap: 11 (ref 5–15)
BUN: 8 mg/dL (ref 6–20)
CO2: 23 mmol/L (ref 22–32)
Calcium: 8.8 mg/dL — ABNORMAL LOW (ref 8.9–10.3)
Chloride: 101 mmol/L (ref 98–111)
Creatinine, Ser: 0.73 mg/dL (ref 0.44–1.00)
GFR, Estimated: >60 mL/min (ref >60)
Glucose, Bld: 108 mg/dL — ABNORMAL HIGH (ref 70–99)
Potassium: 3.7 mmol/L (ref 3.5–5.1)
Sodium: 135 mmol/L (ref 135–145)

## 2024-07-15 LAB — RESP PANEL BY RT-PCR (RSV, FLU A&B, COVID)  RVPGX2
Influenza A by PCR: NEGATIVE
Influenza B by PCR: NEGATIVE
Resp Syncytial Virus by PCR: NEGATIVE
SARS Coronavirus 2 by RT PCR: NEGATIVE

## 2024-07-15 NOTE — ED Triage Notes (Signed)
 Patient to ED via [facility] with complaints of headache; temp 103.1 with EMS, after Tylenol  temp 103.4. Patient denies known sick contacts.    Tylenol  1000mg  Zofran  4mg 

## 2024-07-15 NOTE — ED Notes (Signed)
 Pt noted to be wheeled out of ED by family member. Left in vehicle. Pt did not speak to staff prior to leaving ED. NAD noted.

## 2024-09-27 ENCOUNTER — Emergency Department: Admission: EM | Admit: 2024-09-27 | Discharge: 2024-09-27 | Disposition: A | Discharge status: Home / Self Care

## 2024-09-27 ENCOUNTER — Other Ambulatory Visit: Payer: Self-pay

## 2024-09-27 DIAGNOSIS — R569 Unspecified convulsions: Secondary | ICD-10-CM | POA: Insufficient documentation

## 2024-09-27 DIAGNOSIS — Z5321 Procedure and treatment not carried out due to patient leaving prior to being seen by health care provider: Secondary | ICD-10-CM | POA: Insufficient documentation

## 2024-09-27 LAB — CBC
HCT: 38.3 % (ref 36.0–46.0)
Hemoglobin: 12.9 g/dL (ref 12.0–15.0)
MCH: 28.9 pg (ref 26.0–34.0)
MCHC: 33.7 g/dL (ref 30.0–36.0)
MCV: 85.9 fL (ref 80.0–100.0)
Platelets: 331 K/uL (ref 150–400)
RBC: 4.46 MIL/uL (ref 3.87–5.11)
RDW: 13.3 % (ref 11.5–15.5)
WBC: 11.5 K/uL — ABNORMAL HIGH (ref 4.0–10.5)
nRBC: 0 % (ref 0.0–0.2)

## 2024-09-27 LAB — COMPREHENSIVE METABOLIC PANEL WITH GFR
ALT: 22 U/L (ref 0–44)
AST: 14 U/L — ABNORMAL LOW (ref 15–41)
Albumin: 4.3 g/dL (ref 3.5–5.0)
Alkaline Phosphatase: 58 U/L (ref 38–126)
Anion gap: 10 (ref 5–15)
BUN: 7 mg/dL (ref 6–20)
CO2: 23 mmol/L (ref 22–32)
Calcium: 9 mg/dL (ref 8.9–10.3)
Chloride: 105 mmol/L (ref 98–111)
Creatinine, Ser: 0.67 mg/dL (ref 0.44–1.00)
GFR, Estimated: >60 mL/min
Glucose, Bld: 124 mg/dL — ABNORMAL HIGH (ref 70–99)
Potassium: 4.1 mmol/L (ref 3.5–5.1)
Sodium: 137 mmol/L (ref 135–145)
Total Bilirubin: 0.3 mg/dL (ref 0.0–1.2)
Total Protein: 7.1 g/dL (ref 6.5–8.1)

## 2024-09-27 LAB — HCG, QUANTITATIVE, PREGNANCY: hCG, Beta Chain, Quant, S: 13069 m[IU]/mL — ABNORMAL HIGH

## 2024-09-27 LAB — POC URINE PREG, ED: Preg Test, Ur: POSITIVE — AB

## 2024-09-27 NOTE — ED Notes (Signed)
 Pt came to desk and informed RN that she is going to leave and follow up with her neurologist. Discussed with patient the risks of leaving. She states she needs to pick up her son and needs to leave now. Pt walked out with steady gait.

## 2024-09-27 NOTE — ED Triage Notes (Signed)
 Pt comes with c/o seizures this morning. Pt states she is  on keppra  for it and missed yesterday dose. Pt had seizure this morning and last about 4 minutes. Pt not alert and oriented. Pt answering questions.   Pt did bit her tongue. Pt was placed on side by family. Pt bumped head on headboard. Pt had seizure while in sleep.
